# Patient Record
Sex: Female | Born: 1950 | Race: White | Hispanic: No | Smoking: Never smoker
Health system: Southern US, Community
[De-identification: ages and names within clinical notes are randomized; demographics above are authoritative.]

## PROBLEM LIST (undated history)

## (undated) DIAGNOSIS — R011 Cardiac murmur, unspecified: Secondary | ICD-10-CM

## (undated) DIAGNOSIS — J189 Pneumonia, unspecified organism: Secondary | ICD-10-CM

## (undated) DIAGNOSIS — I10 Essential (primary) hypertension: Secondary | ICD-10-CM

## (undated) DIAGNOSIS — C801 Malignant (primary) neoplasm, unspecified: Secondary | ICD-10-CM

## (undated) HISTORY — DX: Essential (primary) hypertension: I10

## (undated) HISTORY — PX: OOPHORECTOMY: SHX86

---

## 2015-05-11 ENCOUNTER — Other Ambulatory Visit: Payer: Self-pay

## 2015-05-11 DIAGNOSIS — Z1231 Encounter for screening mammogram for malignant neoplasm of breast: Secondary | ICD-10-CM

## 2015-06-01 ENCOUNTER — Ambulatory Visit
Admission: RE | Admit: 2015-06-01 | Discharge: 2015-06-01 | Disposition: A | Discharge status: Home / Self Care | Payer: BLUE CROSS/BLUE SHIELD | Source: Ambulatory Visit

## 2015-06-01 DIAGNOSIS — Z1231 Encounter for screening mammogram for malignant neoplasm of breast: Secondary | ICD-10-CM

## 2016-05-18 DIAGNOSIS — L570 Actinic keratosis: Secondary | ICD-10-CM | POA: Diagnosis not present

## 2016-05-18 DIAGNOSIS — D229 Melanocytic nevi, unspecified: Secondary | ICD-10-CM | POA: Diagnosis not present

## 2016-05-18 DIAGNOSIS — L814 Other melanin hyperpigmentation: Secondary | ICD-10-CM | POA: Diagnosis not present

## 2016-05-31 DIAGNOSIS — I87393 Chronic venous hypertension (idiopathic) with other complications of bilateral lower extremity: Secondary | ICD-10-CM | POA: Diagnosis not present

## 2016-10-10 DIAGNOSIS — Z0000 Encounter for general adult medical examination without abnormal findings: Secondary | ICD-10-CM | POA: Diagnosis not present

## 2016-10-10 DIAGNOSIS — I10 Essential (primary) hypertension: Secondary | ICD-10-CM | POA: Diagnosis not present

## 2016-10-11 ENCOUNTER — Other Ambulatory Visit: Payer: Self-pay | Admitting: Internal Medicine

## 2016-10-11 DIAGNOSIS — R198 Other specified symptoms and signs involving the digestive system and abdomen: Secondary | ICD-10-CM

## 2016-10-14 ENCOUNTER — Other Ambulatory Visit: Payer: BLUE CROSS/BLUE SHIELD

## 2016-10-18 ENCOUNTER — Ambulatory Visit
Admission: RE | Admit: 2016-10-18 | Discharge: 2016-10-18 | Disposition: A | Discharge status: Home / Self Care | Payer: Medicare Other | Source: Ambulatory Visit | Attending: Internal Medicine | Admitting: Internal Medicine

## 2016-10-18 DIAGNOSIS — R198 Other specified symptoms and signs involving the digestive system and abdomen: Secondary | ICD-10-CM

## 2016-10-18 DIAGNOSIS — Z8249 Family history of ischemic heart disease and other diseases of the circulatory system: Secondary | ICD-10-CM | POA: Diagnosis not present

## 2016-10-18 DIAGNOSIS — R0989 Other specified symptoms and signs involving the circulatory and respiratory systems: Secondary | ICD-10-CM | POA: Diagnosis not present

## 2016-11-14 DIAGNOSIS — I10 Essential (primary) hypertension: Secondary | ICD-10-CM | POA: Diagnosis not present

## 2016-11-21 DIAGNOSIS — I10 Essential (primary) hypertension: Secondary | ICD-10-CM | POA: Diagnosis not present

## 2016-11-24 DIAGNOSIS — I10 Essential (primary) hypertension: Secondary | ICD-10-CM | POA: Diagnosis not present

## 2016-12-20 DIAGNOSIS — Z23 Encounter for immunization: Secondary | ICD-10-CM | POA: Diagnosis not present

## 2017-02-09 DIAGNOSIS — Z1231 Encounter for screening mammogram for malignant neoplasm of breast: Secondary | ICD-10-CM | POA: Diagnosis not present

## 2017-02-16 ENCOUNTER — Other Ambulatory Visit: Payer: Self-pay | Admitting: Radiology

## 2017-02-16 DIAGNOSIS — C50912 Malignant neoplasm of unspecified site of left female breast: Secondary | ICD-10-CM | POA: Diagnosis not present

## 2017-02-16 DIAGNOSIS — C50512 Malignant neoplasm of lower-outer quadrant of left female breast: Secondary | ICD-10-CM | POA: Diagnosis not present

## 2017-02-16 DIAGNOSIS — N6323 Unspecified lump in the left breast, lower outer quadrant: Secondary | ICD-10-CM | POA: Diagnosis not present

## 2017-02-17 ENCOUNTER — Telehealth: Payer: Self-pay | Admitting: Hematology and Oncology

## 2017-02-17 ENCOUNTER — Encounter: Payer: Self-pay | Admitting: *Deleted

## 2017-02-17 DIAGNOSIS — M25561 Pain in right knee: Secondary | ICD-10-CM | POA: Diagnosis not present

## 2017-02-17 NOTE — Telephone Encounter (Signed)
Confirmed afternoon [facility] appointment with patient for 02/22/17, patient is a [facility] patient but appointment reminder will be mailed

## 2017-02-21 ENCOUNTER — Other Ambulatory Visit: Payer: Self-pay | Admitting: *Deleted

## 2017-02-21 ENCOUNTER — Encounter: Payer: Self-pay | Admitting: *Deleted

## 2017-02-21 DIAGNOSIS — Z170 Estrogen receptor positive status [ER+]: Secondary | ICD-10-CM

## 2017-02-21 DIAGNOSIS — C50512 Malignant neoplasm of lower-outer quadrant of left female breast: Secondary | ICD-10-CM

## 2017-02-22 ENCOUNTER — Other Ambulatory Visit (HOSPITAL_BASED_OUTPATIENT_CLINIC_OR_DEPARTMENT_OTHER): Payer: Medicare Other

## 2017-02-22 ENCOUNTER — Ambulatory Visit
Admission: RE | Admit: 2017-02-22 | Discharge: 2017-02-22 | Disposition: A | Discharge status: Home / Self Care | Payer: Medicare Other | Source: Ambulatory Visit | Attending: Radiation Oncology | Admitting: Radiation Oncology

## 2017-02-22 ENCOUNTER — Encounter: Payer: Self-pay | Admitting: Hematology and Oncology

## 2017-02-22 ENCOUNTER — Encounter: Payer: Self-pay | Admitting: Radiation Oncology

## 2017-02-22 ENCOUNTER — Ambulatory Visit: Payer: Self-pay | Admitting: General Surgery

## 2017-02-22 ENCOUNTER — Ambulatory Visit: Payer: Medicare Other | Admitting: Physical Therapy

## 2017-02-22 ENCOUNTER — Ambulatory Visit (HOSPITAL_BASED_OUTPATIENT_CLINIC_OR_DEPARTMENT_OTHER): Payer: Medicare Other | Admitting: Hematology and Oncology

## 2017-02-22 DIAGNOSIS — Z23 Encounter for immunization: Secondary | ICD-10-CM | POA: Diagnosis not present

## 2017-02-22 DIAGNOSIS — Z510 Encounter for antineoplastic radiation therapy: Secondary | ICD-10-CM | POA: Insufficient documentation

## 2017-02-22 DIAGNOSIS — C50512 Malignant neoplasm of lower-outer quadrant of left female breast: Secondary | ICD-10-CM

## 2017-02-22 DIAGNOSIS — Z170 Estrogen receptor positive status [ER+]: Principal | ICD-10-CM

## 2017-02-22 DIAGNOSIS — C50912 Malignant neoplasm of unspecified site of left female breast: Secondary | ICD-10-CM | POA: Diagnosis not present

## 2017-02-22 DIAGNOSIS — Z8042 Family history of malignant neoplasm of prostate: Secondary | ICD-10-CM | POA: Insufficient documentation

## 2017-02-22 DIAGNOSIS — I10 Essential (primary) hypertension: Secondary | ICD-10-CM | POA: Insufficient documentation

## 2017-02-22 DIAGNOSIS — Z801 Family history of malignant neoplasm of trachea, bronchus and lung: Secondary | ICD-10-CM | POA: Insufficient documentation

## 2017-02-22 DIAGNOSIS — Z803 Family history of malignant neoplasm of breast: Secondary | ICD-10-CM | POA: Diagnosis not present

## 2017-02-22 LAB — COMPREHENSIVE METABOLIC PANEL
ALBUMIN: 4.5 g/dL (ref 3.5–5.0)
ALK PHOS: 63 U/L (ref 40–150)
ALT: 21 U/L (ref 0–55)
AST: 21 U/L (ref 5–34)
Anion Gap: 11 mEq/L (ref 3–11)
BUN: 11.1 mg/dL (ref 7.0–26.0)
CO2: 29 mEq/L (ref 22–29)
Calcium: 10 mg/dL (ref 8.4–10.4)
Chloride: 94 mEq/L — ABNORMAL LOW (ref 98–109)
Creatinine: 0.9 mg/dL (ref 0.6–1.1)
GLUCOSE: 137 mg/dL (ref 70–140)
POTASSIUM: 4.1 meq/L (ref 3.5–5.1)
SODIUM: 134 meq/L — AB (ref 136–145)
Total Bilirubin: 0.96 mg/dL (ref 0.20–1.20)
Total Protein: 7.5 g/dL (ref 6.4–8.3)

## 2017-02-22 LAB — CBC WITH DIFFERENTIAL/PLATELET
BASO%: 0.5 % (ref 0.0–2.0)
BASOS ABS: 0 10*3/uL (ref 0.0–0.1)
EOS ABS: 0 10*3/uL (ref 0.0–0.5)
EOS%: 0.6 % (ref 0.0–7.0)
HCT: 41.1 % (ref 34.8–46.6)
HEMOGLOBIN: 13.6 g/dL (ref 11.6–15.9)
LYMPH%: 18.9 % (ref 14.0–49.7)
MCH: 31.5 pg (ref 25.1–34.0)
MCHC: 33.1 g/dL (ref 31.5–36.0)
MCV: 95.2 fL (ref 79.5–101.0)
MONO#: 0.4 10*3/uL (ref 0.1–0.9)
MONO%: 7.4 % (ref 0.0–14.0)
NEUT#: 3.8 10*3/uL (ref 1.5–6.5)
NEUT%: 72.6 % (ref 38.4–76.8)
Platelets: 306 10*3/uL (ref 145–400)
RBC: 4.32 10*6/uL (ref 3.70–5.45)
RDW: 13.1 % (ref 11.2–14.5)
WBC: 5.2 10*3/uL (ref 3.9–10.3)
lymph#: 1 10*3/uL (ref 0.9–3.3)

## 2017-02-22 NOTE — Progress Notes (Signed)
Nutrition Assessment  Reason for Assessment:  Pt seen in Breast Clinic  ASSESSMENT:    66 year old female with new diagnosis of breast cancer.  Past medical history reviewed.  Patient reports good appetite  Medications:  reviewed  Labs: reviewed  Anthropometrics:   Height: 67 inches Weight: 140 lb BMI: 21   NUTRITION DIAGNOSIS: Food and nutrition related knowledge deficit related to new diagnosis of breast cancer as evidenced by no prior need for nutrition related information.  INTERVENTION:   Discussed and provided packet of information regarding nutritional tips for breast cancer patients.  Questions answered.  Teachback method used.  Contact information provided and patient knows to contact me with questions/concerns.    MONITORING, EVALUATION, and GOAL: Pt will consume a healthy plant based diet to maintain lean body mass throughout treatment.   Iver Miklas B. Zenia Resides, [place], [place] Registered Dietitian [phone] (pager)

## 2017-02-22 NOTE — Progress Notes (Signed)
Radiation Oncology         [phone] ________________________________  Name: Nichole Ortega        MRN: [id number]  Date of Service: 02/22/2017 DOB: [birth year]  CC:Kim, Jeneen Rinks, MD  Excell Seltzer, MD     REFERRING PHYSICIAN: Excell Seltzer, MD   ATTESTATION Please see the note from Shona Simpson, PA-C from today's visit for more details of today's encounter.  I have personally performed a face to face diagnostic evaluation on this patient and devised the following assessment and plan.  The patient was seen today in clinic preoperatively for her diagnosis of breast cancer. The patient appears to be a good candidate for breast conservation treatment. We discussed the recommendation to subsequently proceed with adjuvant radiation treatment at the appropriate time. We discussed the potential benefit of treatment as well as possible side effects and risks. All of the patient's questions were answered. I look forward to seeing the patient at the appropriate time postoperatively to further coordinate her care. I would anticipate treating the patient with tangent whole breast radiation treatment fields for 4 - 6 1/2 weeks, potentially 4 weeks base on her current information.   Kyung Rudd, MD    DIAGNOSIS: The encounter diagnosis was Malignant neoplasm of lower-outer quadrant of left breast of female, estrogen receptor positive ([place]).   HISTORY OF PRESENT ILLNESS: Nichole Ortega is a 67 y.o. female seen at multidisciplinary clinic for newly diagnosed left breast cancer. Routine screening mammogram on 02/09/17 detected an abnormality in the lower outer quadrant of the left breast. Ultrasound of the left breast on 02/16/17 showed an 8 mm mass at the 4 o'clock position. Axilla was negative. Biopsy on of the mass on 02/16/17 showed invasive mammary carcinoma, grade II. Receptor status was ER+ PR+ Ki67 15% and Her2 negative. The patient presents today to discuss the role of radiation therapy as  part of her disease management.   PREVIOUS RADIATION THERAPY: No   PAST MEDICAL HISTORY:  Past Medical History:  Diagnosis Date  . Hypertension        PAST SURGICAL HISTORY: Past Surgical History:  Procedure Laterality Date  . OOPHORECTOMY Right      FAMILY HISTORY:  Family History  Problem Relation Age of Onset  . Breast cancer Paternal Grandmother 12  . Breast cancer Cousin 72     SOCIAL HISTORY:  reports that  has never smoked. She does not have any smokeless tobacco history on file. She reports that she drinks alcohol. She reports that she does not use drugs.   ALLERGIES: Patient has no known allergies.   MEDICATIONS:  Current Outpatient Medications  Medication Sig Dispense Refill  . amLODipine (NORVASC) 10 MG tablet Take 10 mg by mouth daily.     No current facility-administered medications for this encounter.      REVIEW OF SYSTEMS: On review of systems, the patient reports that she is positive for right leg muscle aches, eczema, and joint pain.  She denies any chest pain, shortness of breath, cough, fevers, chills, night sweats, unintended weight changes. She denies any bowel or bladder disturbances, and denies abdominal pain, nausea or vomiting. She denies any new musculoskeletal or joint aches or pains. A complete review of systems is obtained and is otherwise negative.     PHYSICAL EXAM:  Wt Readings from Last 3 Encounters:  02/22/17 140 lb 3.2 oz (63.6 kg)   Temp Readings from Last 3 Encounters:  02/22/17 98.3 F (36.8 C) (Oral)   BP Readings from  Last 3 Encounters:  02/22/17 (!) 148/74   Pulse Readings from Last 3 Encounters:  02/22/17 80    /10  In general this is a well appearing woman in no acute distress. She is alert and oriented x4 and appropriate throughout the examination. HEENT reveals that the patient is normocephalic, atraumatic. EOMs are intact. PERRLA. Skin is intact without any evidence of gross lesions. Cardiovascular exam  reveals a regular rate and rhythm, no clicks rubs or murmurs are auscultated. Chest is clear to auscultation bilaterally. Lymphatic assessment is performed and does not reveal any adenopathy in the cervical, supraclavicular, axillary, or inguinal chains. Abdomen has active bowel sounds in all quadrants and is intact. The abdomen is soft, non tender, non distended. Lower extremities are negative for pretibial pitting edema, deep calf tenderness, cyanosis or clubbing.  Breast exam revealed ecchymosis at the biopsy site. No nipple discharge or palpable masses bilaterally.   ECOG = 0  0 - Asymptomatic (Fully active, able to carry on all predisease activities without restriction)  1 - Symptomatic but completely ambulatory (Restricted in physically strenuous activity but ambulatory and able to carry out work of a light or sedentary nature. For example, light housework, office work)  2 - Symptomatic, <50% in bed during the day (Ambulatory and capable of all self care but unable to carry out any work activities. Up and about more than 50% of waking hours)  3 - Symptomatic, >50% in bed, but not bedbound (Capable of only limited self-care, confined to bed or chair 50% or more of waking hours)  4 - Bedbound (Completely disabled. Cannot carry on any self-care. Totally confined to bed or chair)  5 - Death   Eustace Pen MM, Creech RH, Tormey DC, et al. [phone]). "Toxicity and response criteria of the [facility]". [place] Oncol. 5 (6): 649-55    LABORATORY DATA:  Lab Results  Component Value Date   WBC 5.2 02/22/2017   HGB 13.6 02/22/2017   HCT 41.1 02/22/2017   MCV 95.2 02/22/2017   PLT 306 02/22/2017   Lab Results  Component Value Date   NA 134 (L) 02/22/2017   K 4.1 02/22/2017   CO2 29 02/22/2017   Lab Results  Component Value Date   ALT 21 02/22/2017   AST 21 02/22/2017   ALKPHOS 63 02/22/2017   BILITOT 0.96 02/22/2017      RADIOGRAPHY: No results found.      IMPRESSION/PLAN: 1. 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 Dr. Lisbeth Renshaw discussed the pathology findings and nature of breast cancer. We discussed the risks, benefits, short and long term effects of radiotherapy. The patient understands and wishes to proceed. Dr. Lisbeth Renshaw discussed the logistics of radiotherapy and anticipates 4 weeks of treatment. The patient will undergo breast conserving treatment and be scheduled for left lumpectomy with sentinel lymph node biopsy followed by radiation therapy. This will be followed by an aromatase inhibitor.  The above documentation reflects my direct findings during this shared patient visit. Please see the separate note by Dr. Lisbeth Renshaw on this date for the remainder of the patient's plan of care.    This document serves as a record of services personally performed by Kyung Rudd, MD and Freeman Caldron, PA-C. It was created on their behalf by Bethann Humble, a trained medical scribe. The creation of this record is based on the scribe's personal observations and the provider's statements to them. This document has  been checked and approved by the attending provider.

## 2017-02-22 NOTE — Assessment & Plan Note (Signed)
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 After reviewing the pathology in detail, we proceeded to discuss the different treatment options between surgery, radiation, chemotherapy, antiestrogen therapies.  Recommendations: 1. Breast conserving surgery followed by 2. Oncotype DX testing to determine if chemotherapy would be of any benefit followed by 3. Adjuvant radiation therapy followed by 4. Adjuvant antiestrogen therapy  Oncotype counseling: I discussed Oncotype DX test. I explained to the patient that this is a 21 gene panel to evaluate patient tumors DNA to calculate recurrence score. This would help determine whether patient has high risk or intermediate risk or low risk breast cancer. She understands that if her tumor was found to be high risk, she would benefit from systemic chemotherapy. If low risk, no need of chemotherapy. If she was found to be intermediate risk, we would need to evaluate the score as well as other risk factors and determine if an abbreviated chemotherapy may be of benefit.  Patient is not keen on chemotherapy or even in antiestrogen therapy.  She is willing to consider antiestrogen therapy if she has a high risk of recurrence.  So for this reason alone we may need to perform Oncotype DX testing.  Return to clinic after surgery to discuss the final pathology report.   

## 2017-02-22 NOTE — Progress Notes (Signed)
[place] NOTE  Patient Care Team: Jani Gravel, MD as PCP - General (Internal Medicine)  CHIEF COMPLAINTS/PURPOSE OF CONSULTATION:  Newly diagnosed breast cancer  HISTORY OF PRESENTING ILLNESS:  Nichole Ortega 66 y.o. female is here because of recent diagnosis of left breast cancer.  Patient had a screening mammogram that detected a left breast mass at 4 o'clock position measuring 8 mm in size.  Biopsy of this revealed invasive ductal carcinoma that was ER PR positive HER-2 negative with a Ki-67 of 15%.  She was presented this morning in the multidisciplinary tumor board and she is here today to discuss her treatment plan.  She came by herself to the clinic.  I reviewed her records extensively and collaborated the history with the patient.  SUMMARY OF ONCOLOGIC HISTORY:   Malignant neoplasm of lower-outer quadrant of left breast of female, estrogen receptor positive ([place])   02/16/2017 Initial Diagnosis    Screening detected left breast mass 8 mm at 4 o'clock position axilla negative biopsy grade 2 invasive ductal carcinoma ER 100%, PR 100%, Ki-67 15%, HER-2 negative ratio 1.58, T1 BN 0 stage I a clinical stage      MEDICAL HISTORY:  Past Medical History:  Diagnosis Date  . Hypertension     SURGICAL HISTORY: Past Surgical History:  Procedure Laterality Date  . OOPHORECTOMY Right     SOCIAL HISTORY: Social History   Socioeconomic History  . Marital status: Widowed    Spouse name: Not on file  . Number of children: Not on file  . Years of education: Not on file  . Highest education level: Not on file  Social Needs  . Financial resource strain: Not on file  . Food insecurity - worry: Not on file  . Food insecurity - inability: Not on file  . Transportation needs - medical: Not on file  . Transportation needs - non-medical: Not on file  Occupational History  . Not on file  Tobacco Use  . Smoking status: Never Smoker  Substance and Sexual Activity   . Alcohol use: Yes  . Drug use: No  . Sexual activity: Not on file  Other Topics Concern  . Not on file  Social History Narrative  . Not on file    FAMILY HISTORY: Family History  Problem Relation Age of Onset  . Breast cancer Paternal Grandmother 64  . Breast cancer Cousin 67    ALLERGIES:  has no allergies on file.  MEDICATIONS:  Current Outpatient Medications  Medication Sig Dispense Refill  . amLODipine (NORVASC) 10 MG tablet Take 10 mg by mouth daily.     No current facility-administered medications for this visit.     REVIEW OF SYSTEMS:   Constitutional: Denies fevers, chills or abnormal night sweats Eyes: Denies blurriness of vision, double vision or watery eyes Ears, nose, mouth, throat, and face: Denies mucositis or sore throat Respiratory: Denies cough, dyspnea or wheezes Cardiovascular: Denies palpitation, chest discomfort or lower extremity swelling Gastrointestinal:  Denies nausea, heartburn or change in bowel habits Skin: Denies abnormal skin rashes Lymphatics: Denies new lymphadenopathy or easy bruising Neurological:Denies numbness, tingling or new weaknesses Behavioral/Psych: Mood is stable, no new changes  Breast:  Denies any palpable lumps or discharge All other systems were reviewed with the patient and are negative.  PHYSICAL EXAMINATION: ECOG PERFORMANCE STATUS: 0 - Asymptomatic  Vitals:   02/22/17 1301  BP: (!) 148/74  Pulse: 80  Resp: 18  Temp: 98.3 F (36.8 C)  SpO2: 99%  Filed Weights   02/22/17 1301  Weight: 140 lb 3.2 oz (63.6 kg)    GENERAL:alert, no distress and comfortable SKIN: skin color, texture, turgor are normal, no rashes or significant lesions EYES: normal, conjunctiva are pink and non-injected, sclera clear OROPHARYNX:no exudate, no erythema and lips, buccal mucosa, and tongue normal  NECK: supple, thyroid normal size, non-tender, without nodularity LYMPH:  no palpable lymphadenopathy in the cervical, axillary or  inguinal LUNGS: clear to auscultation and percussion with normal breathing effort HEART: regular rate & rhythm and no murmurs and no lower extremity edema ABDOMEN:abdomen soft, non-tender and normal bowel sounds Musculoskeletal:no cyanosis of digits and no clubbing  PSYCH: alert & oriented x 3 with fluent speech NEURO: no focal motor/sensory deficits BREAST: No palpable nodules in breast. No palpable axillary or supraclavicular lymphadenopathy (exam performed in the presence of a chaperone)   LABORATORY DATA:  I have reviewed the data as listed Lab Results  Component Value Date   WBC 5.2 02/22/2017   HGB 13.6 02/22/2017   HCT 41.1 02/22/2017   MCV 95.2 02/22/2017   PLT 306 02/22/2017   Lab Results  Component Value Date   NA 134 (L) 02/22/2017   K 4.1 02/22/2017   CO2 29 02/22/2017    RADIOGRAPHIC STUDIES: I have personally reviewed the radiological reports and agreed with the findings in the report.  ASSESSMENT AND PLAN:  Malignant neoplasm of lower-outer quadrant of left breast of female, estrogen receptor positive ([place]) 02/16/2017:Screening detected left breast mass 8 mm at 4 o'clock position axilla negative biopsy grade 2 invasive ductal carcinoma ER 100%, PR 100%, Ki-67 15%, HER-2 negative ratio 1.58, T1 BN 0 stage I a clinical stage  Pathology and radiology counseling:Discussed with the patient, the details of pathology including the type of breast cancer,the clinical staging, the significance of ER, PR and HER-2/neu receptors and the implications for treatment. After reviewing the pathology in detail, we proceeded to discuss the different treatment options between surgery, radiation, chemotherapy, antiestrogen therapies.  Recommendations: 1. Breast conserving surgery followed by 2. Oncotype DX testing to determine if chemotherapy would be of any benefit followed by 3. Adjuvant radiation therapy followed by 4. Adjuvant antiestrogen therapy  Oncotype counseling: I  discussed Oncotype DX test. I explained to the patient that this is a 21 gene panel to evaluate patient tumors DNA to calculate recurrence score. This would help determine whether patient has high risk or intermediate risk or low risk breast cancer. She understands that if her tumor was found to be high risk, she would benefit from systemic chemotherapy. If low risk, no need of chemotherapy. If she was found to be intermediate risk, we would need to evaluate the score as well as other risk factors and determine if an abbreviated chemotherapy may be of benefit.  Patient is not keen on chemotherapy or even in antiestrogen therapy.  She is willing to consider antiestrogen therapy if she has a high risk of recurrence.  So for this reason alone we may need to perform Oncotype DX testing.  She also has a friend who is an Materials engineer and she will run the plan by him. Return to clinic after surgery to discuss the final pathology report.  All questions were answered. The patient knows to call the clinic with any problems, questions or concerns.    [name], MD 02/22/17

## 2017-02-23 ENCOUNTER — Encounter: Payer: Self-pay | Admitting: Radiation Oncology

## 2017-02-25 ENCOUNTER — Telehealth: Payer: Self-pay | Admitting: Hematology and Oncology

## 2017-02-25 NOTE — Telephone Encounter (Signed)
Spoke with patient regarding appt per sched msg 12/20

## 2017-02-27 ENCOUNTER — Other Ambulatory Visit (HOSPITAL_COMMUNITY): Payer: Self-pay | Admitting: *Deleted

## 2017-02-27 NOTE — Pre-Procedure Instructions (Signed)
Nichole Ortega  02/27/2017    Your procedure is scheduled on Friday, March 03, 2017 at 9:30 AM.   Report to [facility] Entrance "A" Admitting Office at 7:30 AM.   Call this number if you have problems the morning of surgery: [phone]   Questions prior to day of surgery, please call [phone] between 8 & 4 PM.   Remember:  Do not eat food or drink liquids after midnight Thursday, 03/02/17.  Take these medicines the morning of surgery with A SIP OF WATER: Amlodipine (Norvasc)  Drink bottle of Ensure just prior to leaving for hospital day of surgery.   Do not wear jewelry, make-up or nail polish.  Do not wear lotions, powders, perfumes or deodorant.  Do not shave 48 hours prior to surgery.   Do not bring valuables to the hospital.  [facility] is not responsible for any belongings or valuables.  Contacts, dentures or bridgework may not be worn into surgery.  Leave your suitcase in the car.  After surgery it may be brought to your room.  For patients admitted to the hospital, discharge time will be determined by your treatment team.  Patients discharged the day of surgery will not be allowed to drive home.   [place] - Preparing for Surgery  Before surgery, you can play an important role.  Because skin is not sterile, your skin needs to be as free of germs as possible.  You can reduce the number of germs on you skin by washing with CHG (chlorahexidine gluconate) soap before surgery.  CHG is an antiseptic cleaner which kills germs and bonds with the skin to continue killing germs even after washing.  Please DO NOT use if you have an allergy to CHG or antibacterial soaps.  If your skin becomes reddened/irritated stop using the CHG and inform your nurse when you arrive at Short Stay.  Do not shave (including legs and underarms) for at least 48 hours prior to the first CHG shower.  You may shave your face.  Please follow these instructions carefully:   1.   Shower with CHG Soap the night before surgery and the                    morning of Surgery.  2.  If you choose to wash your hair, wash your hair first as usual with your       normal shampoo.  3.  After you shampoo, rinse your hair and body thoroughly to remove the shampoo.  4.  Use CHG as you would any other liquid soap.  You can apply chg directly       to the skin and wash gently with scrungie or a clean washcloth.  5.  Apply the CHG Soap to your body ONLY FROM THE NECK DOWN.        Do not use on open wounds or open sores.  Avoid contact with your eyes, ears, mouth and genitals (private parts).  Wash genitals (private parts) with your normal soap.  6.  Wash thoroughly, paying special attention to the area where your surgery        will be performed.  7.  Thoroughly rinse your body with warm water from the neck down.  8.  DO NOT shower/wash with your normal soap after using and rinsing off       the CHG Soap.  9.  Pat yourself dry with a clean towel.  10.  Wear clean pajamas.            11.  Place clean sheets on your bed the night of your first shower and do not        sleep with pets.  Day of Surgery  Shower as above. Do not apply any lotions/deodorants the morning of surgery.  Please wear clean clothes to the hospital.   Please read over the fact sheets that you were given.

## 2017-03-01 ENCOUNTER — Encounter (HOSPITAL_COMMUNITY): Payer: Self-pay

## 2017-03-01 ENCOUNTER — Encounter (HOSPITAL_COMMUNITY)
Admission: RE | Admit: 2017-03-01 | Discharge: 2017-03-01 | Disposition: A | Discharge status: Home / Self Care | Payer: Medicare Other | Source: Ambulatory Visit | Attending: General Surgery | Admitting: General Surgery

## 2017-03-01 ENCOUNTER — Encounter: Payer: Self-pay | Admitting: General Practice

## 2017-03-01 ENCOUNTER — Other Ambulatory Visit: Payer: Self-pay

## 2017-03-01 ENCOUNTER — Telehealth: Payer: Self-pay | Admitting: *Deleted

## 2017-03-01 DIAGNOSIS — Z170 Estrogen receptor positive status [ER+]: Secondary | ICD-10-CM | POA: Diagnosis not present

## 2017-03-01 DIAGNOSIS — I10 Essential (primary) hypertension: Secondary | ICD-10-CM | POA: Diagnosis not present

## 2017-03-01 DIAGNOSIS — C50512 Malignant neoplasm of lower-outer quadrant of left female breast: Secondary | ICD-10-CM | POA: Diagnosis not present

## 2017-03-01 DIAGNOSIS — N6012 Diffuse cystic mastopathy of left breast: Secondary | ICD-10-CM | POA: Diagnosis not present

## 2017-03-01 HISTORY — DX: Pneumonia, unspecified organism: J18.9

## 2017-03-01 HISTORY — DX: Malignant (primary) neoplasm, unspecified: C80.1

## 2017-03-01 HISTORY — DX: Cardiac murmur, unspecified: R01.1

## 2017-03-01 LAB — BASIC METABOLIC PANEL
ANION GAP: 12 (ref 5–15)
BUN: 9 mg/dL (ref 6–20)
CHLORIDE: 95 mmol/L — AB (ref 101–111)
CO2: 25 mmol/L (ref 22–32)
Calcium: 9.8 mg/dL (ref 8.9–10.3)
Creatinine, Ser: 0.5 mg/dL (ref 0.44–1.00)
GFR calc non Af Amer: >60 mL/min (ref >60)
GLUCOSE: 91 mg/dL (ref 65–99)
POTASSIUM: 4.2 mmol/L (ref 3.5–5.1)
Sodium: 132 mmol/L — ABNORMAL LOW (ref 135–145)

## 2017-03-01 LAB — CBC
HEMATOCRIT: 41.5 % (ref 36.0–46.0)
HEMOGLOBIN: 13.8 g/dL (ref 12.0–15.0)
MCH: 31.8 pg (ref 26.0–34.0)
MCHC: 33.3 g/dL (ref 30.0–36.0)
MCV: 95.6 fL (ref 78.0–100.0)
Platelets: 326 10*3/uL (ref 150–400)
RBC: 4.34 MIL/uL (ref 3.87–5.11)
RDW: 12.8 % (ref 11.5–15.5)
WBC: 6.8 10*3/uL (ref 4.0–10.5)

## 2017-03-01 NOTE — Pre-Procedure Instructions (Signed)
Nichole Ortega  03/01/2017    Your procedure is scheduled on Friday, March 03, 2017 at 9:30 AM.   Report to [facility] Entrance "A" Admitting Office at 7:30 AM.   Call this number if you have problems the morning of surgery: [phone]   Questions prior to day of surgery, please call [phone] between 8 & 4 PM.   Remember:  Do not eat food or drink liquids after midnight Thursday, 03/02/17.  Take these medicines the morning of surgery with A SIP OF WATER: Amlodipine (Norvasc)  EXCEPTION:   Drink bottle of Ensure just prior to leaving for the  hospital day of surgery.   Do not wear jewelry, make-up or nail polish.  Do not wear lotions, powders, perfumes or deodorant.  Do not shave 48 hours prior to surgery.   Do not bring valuables to the hospital.  [facility] is not responsible for any belongings or valuables.  Contacts, dentures or bridgework may not be worn into surgery.  Leave your suitcase in the car.  After surgery it may be brought to your room.  For patients admitted to the hospital, discharge time will be determined by your treatment team.  Patients discharged the day of surgery will not be allowed to drive home.   [place] - Preparing for Surgery  Before surgery, you can play an important role.  Because skin is not sterile, your skin needs to be as free of germs as possible.  You can reduce the number of germs on you skin by washing with CHG (chlorahexidine gluconate) soap before surgery.  CHG is an antiseptic cleaner which kills germs and bonds with the skin to continue killing germs even after washing.  Please DO NOT use if you have an allergy to CHG or antibacterial soaps.  If your skin becomes reddened/irritated stop using the CHG and inform your nurse when you arrive at Short Stay.  Do not shave (including legs and underarms) for at least 48 hours prior to the first CHG shower.  You may shave your face.  Please follow these instructions  carefully:   1.  Shower with CHG Soap the night before surgery and the                    morning of Surgery.  2.  If you choose to wash your hair, wash your hair first as usual with your       normal shampoo.  3.  After you shampoo, rinse your hair and body thoroughly to remove the shampoo.  4.  Use CHG as you would any other liquid soap.  You can apply chg directly       to the skin and wash gently with scrungie or a clean washcloth.  5.  Apply the CHG Soap to your body ONLY FROM THE NECK DOWN.        Do not use on open wounds or open sores.  Avoid contact with your eyes, ears, mouth and genitals (private parts).  Wash genitals (private parts) with your normal soap.  6.  Wash thoroughly, paying special attention to the area where your surgery        will be performed.  7.  Thoroughly rinse your body with warm water from the neck down.  8.  DO NOT shower/wash with your normal soap after using and rinsing off       the CHG Soap.  9.  Pat yourself dry with a clean towel.  10.  Wear clean pajamas.            11.  Place clean sheets on your bed the night of your first shower and do not        sleep with pets.  Day of Surgery  Shower as above. Do not apply any lotions/deodorants the morning of surgery.  Please wear clean clothes to the hospital.   Please read over the fact sheets that you were given.

## 2017-03-01 NOTE — Telephone Encounter (Signed)
Spoke to pt regarding Nichole Ortega from 02/22/17. Denies questions or concerns regarding dx or treatment care plan. Encourage pt to call with needs. Received verbal understanding.

## 2017-03-01 NOTE — Progress Notes (Signed)
[place] presented to [facility], receiving full packet of [place] team/resources. The patient scored a 1 on the Psychosocial Distress Thermometer which indicates mild distress.  ONCBCN DISTRESS SCREENING 03/01/2017  Distress experienced in past week (1-10) 1  Referral to support programs Yes   Follow up needed: No. Pt screens out per protocol, but please refer to [place] team/programs as needed and page if immediate needs arise. Thank you.   [place], [place], [facility] Pager [phone] Voicemail [phone]

## 2017-03-02 DIAGNOSIS — C50912 Malignant neoplasm of unspecified site of left female breast: Secondary | ICD-10-CM | POA: Diagnosis not present

## 2017-03-02 MED ORDER — CEFAZOLIN SODIUM-DEXTROSE 2-4 GM/100ML-% IV SOLN
2.0000 g | INTRAVENOUS | Status: AC
  Administered 2017-03-03: 2 g via INTRAVENOUS
  Filled 2017-03-02: qty 100

## 2017-03-02 NOTE — H&P (Signed)
History of Present Illness Nichole Ortega Nichole Ortega Frampton MD; 02/22/2017 2:49 PM) The patient is a 66 year old female who presents with breast cancer. She is a postmenopausal female referred by Dr. Marcelo Baldy for evaluation of recently diagnosed carcinoma of the left breast. She recently presented for a screening mamogram revealing a new small area of asymmetry. Subsequent imaging included diagnostic mamogram confirming an area of asymmetry and ultrasound showing and 8 mm rounded mass anteriorly at the 4:00 position. An ultrasound guided breast biopsy was performed on 02/16/2017 with pathology revealing invasive ductal carcinoma of the breast. She is seen now in breast multidisciplinary clinic for initial treatment planning. She has experienced no breast symptoms, specifically lump or pain or nipple discharge. She does not have a personal history of any previous breast problems.  Findings at that time were the following: Tumor size: 0.8 cm Tumor grade: 2 Estrogen Receptor: positive Progesterone Receptor: positive Her-2 neu: negative Lymph node status: negative    Past Surgical History Nichole Pummel, RN; 02/22/2017 7:37 AM) Breast Biopsy  Left. Oral Surgery   Diagnostic Studies History Nichole Pummel, RN; 02/22/2017 7:37 AM) Colonoscopy  1-5 years ago Mammogram  within last year Pap Smear  1-5 years ago  Medication History Nichole Ortega T. Nichole Agerton, MD; 02/22/2017 2:44 PM) Medications Reconciled AmLODIPine Besylate (10MG Tablet, Oral daily) Active.  Social History Nichole Pummel, RN; 02/22/2017 7:37 AM) Alcohol use  Occasional alcohol use. Caffeine use  Coffee, Tea. No drug use  Tobacco use  Never smoker.  Family History Nichole Pummel, RN; 02/22/2017 7:37 AM) Cerebrovascular Accident  Father. Hypertension  Father. Melanoma  Mother. Prostate Cancer  Father. Thyroid problems  Father.  Other Problems Nichole Pummel, RN; 02/22/2017 7:37 AM) High blood pressure   Oophorectomy     Review of Systems Nichole Spillers Ledford RN; 02/22/2017 7:37 AM) General Not Present- Appetite Loss, Chills, Fatigue, Fever, Night Sweats, Weight Gain and Weight Loss. Skin Not Present- Change in Wart/Mole, Dryness, Hives, Jaundice, New Lesions, Non-Healing Wounds, Rash and Ulcer. HEENT Present- Wears glasses/contact lenses. Not Present- Earache, Hearing Loss, Hoarseness, Nose Bleed, Oral Ulcers, Ringing in the Ears, Seasonal Allergies, Sinus Pain, Sore Throat, Visual Disturbances and Yellow Eyes. Respiratory Not Present- Bloody sputum, Chronic Cough, Difficulty Breathing, Snoring and Wheezing. Breast Present- Breast Mass. Not Present- Breast Pain, Nipple Discharge and Skin Changes. Cardiovascular Not Present- Chest Pain, Difficulty Breathing Lying Down, Leg Cramps, Palpitations, Rapid Heart Rate, Shortness of Breath and Swelling of Extremities. Gastrointestinal Not Present- Abdominal Pain, Bloating, Bloody Stool, Change in Bowel Habits, Chronic diarrhea, Constipation, Difficulty Swallowing, Excessive gas, Gets full quickly at meals, Hemorrhoids, Indigestion, Nausea, Rectal Pain and Vomiting. Female Genitourinary Not Present- Frequency, Nocturia, Painful Urination, Pelvic Pain and Urgency. Musculoskeletal Present- Joint Pain, Joint Stiffness and Muscle Pain. Not Present- Back Pain, Muscle Weakness and Swelling of Extremities. Neurological Not Present- Decreased Memory, Fainting, Headaches, Numbness, Seizures, Tingling, Tremor, Trouble walking and Weakness. Psychiatric Not Present- Anxiety, Bipolar, Change in Sleep Pattern, Depression, Fearful and Frequent crying. Endocrine Not Present- Cold Intolerance, Excessive Hunger, Hair Changes, Heat Intolerance, Hot flashes and New Diabetes. Hematology Not Present- Blood Thinners, Easy Bruising, Excessive bleeding, Gland problems, HIV and Persistent Infections.   Physical Exam Nichole Ortega T. [name] MD; 02/22/2017 2:50 PM) The physical exam  findings are as follows: Note:General: Alert, thin healthy-appearing Caucasian female, in no distress Skin: Warm and dry without rash or infection. HEENT: No palpable masses or thyromegaly. Sclera nonicteric. Pupils equal round and reactive. Lymph nodes: No cervical, supraclavicular, nodes palpable. Breasts: Some bruising  lower outer left breast post biopsy. Thickening in this area but I cannot feel any other mass in either breast. No skin changes or nipple inversion or crusting. Lungs: Breath sounds clear and equal. No wheezing or increased work of breathing. Cardiovascular: Regular rate and rhythm without murmer. No JVD or edema. Abdomen: Nondistended. Soft and nontender. No masses palpable. No organomegaly. No palpable hernias. Extremities: No edema or joint swelling or deformity. No chronic venous stasis changes. Neurologic: Alert and fully oriented. Gait normal. No focal weakness. Psychiatric: Normal mood and affect. Thought content appropriate with normal judgement and insight    Assessment & Plan Nichole Ortega T. Tarry Fountain MD; 02/22/2017 2:52 PM) MALIGNANT NEOPLASM OF LEFT BREAST, STAGE 1, ESTROGEN RECEPTOR POSITIVE (C50.912) Impression: 66 year old female with a new diagnosis of cancer of the left breast, lower outer quadrant. Clinical stage IA, ER positive, PR positive, HER-2 negative. I discussed with the patient initial surgical treatment options. We discussed options of breast conservation with lumpectomy or total mastectomy and sentinal lymph node biopsy/dissection. Options for reconstruction were discussed. After discussion she has elected to proceed with breast conservation with lumpectomy and axillary sentinel lymph node biopsy. We discussed the indications and nature of the procedure, and expected recovery, in detail. Surgical risks including anesthetic complications, cardiorespiratory complications, bleeding, infection, wound healing complications, blood clots, lymphedema, local and  distant recurrence and possible need for further surgery based on the final pathology was discussed and understood. Chemotherapy, hormonal therapy and radiation therapy have been discussed. They have been provided with literature regarding the treatment of breast cancer. All questions were answered. They understand and agree to proceed and we will go ahead with scheduling. Current Plans radioactive seed localized left breast lumpectomy and left axillary sentinel lymph node biopsy under general anesthesia as an outpatient

## 2017-03-03 ENCOUNTER — Ambulatory Visit (HOSPITAL_COMMUNITY)
Admission: RE | Admit: 2017-03-03 | Discharge: 2017-03-03 | Disposition: A | Discharge status: Home / Self Care | Payer: Medicare Other | Source: Ambulatory Visit | Attending: General Surgery | Admitting: General Surgery

## 2017-03-03 ENCOUNTER — Encounter (HOSPITAL_COMMUNITY): Payer: Self-pay

## 2017-03-03 ENCOUNTER — Ambulatory Visit (HOSPITAL_COMMUNITY): Payer: Medicare Other | Admitting: Certified Registered Nurse Anesthetist

## 2017-03-03 ENCOUNTER — Encounter (HOSPITAL_COMMUNITY)
Admission: RE | Disposition: A | Discharge status: Home / Self Care | Payer: Self-pay | Source: Ambulatory Visit | Attending: General Surgery

## 2017-03-03 ENCOUNTER — Encounter (HOSPITAL_COMMUNITY)
Admission: RE | Admit: 2017-03-03 | Discharge: 2017-03-03 | Disposition: A | Discharge status: Home / Self Care | Payer: Medicare Other | Source: Ambulatory Visit | Attending: General Surgery | Admitting: General Surgery

## 2017-03-03 DIAGNOSIS — C50512 Malignant neoplasm of lower-outer quadrant of left female breast: Secondary | ICD-10-CM | POA: Insufficient documentation

## 2017-03-03 DIAGNOSIS — I10 Essential (primary) hypertension: Secondary | ICD-10-CM | POA: Insufficient documentation

## 2017-03-03 DIAGNOSIS — N6012 Diffuse cystic mastopathy of left breast: Secondary | ICD-10-CM | POA: Diagnosis not present

## 2017-03-03 DIAGNOSIS — Z170 Estrogen receptor positive status [ER+]: Secondary | ICD-10-CM | POA: Diagnosis not present

## 2017-03-03 DIAGNOSIS — C50912 Malignant neoplasm of unspecified site of left female breast: Secondary | ICD-10-CM

## 2017-03-03 DIAGNOSIS — G8918 Other acute postprocedural pain: Secondary | ICD-10-CM | POA: Diagnosis not present

## 2017-03-03 DIAGNOSIS — C50412 Malignant neoplasm of upper-outer quadrant of left female breast: Secondary | ICD-10-CM | POA: Diagnosis not present

## 2017-03-03 HISTORY — PX: BREAST LUMPECTOMY WITH RADIOACTIVE SEED AND SENTINEL LYMPH NODE BIOPSY: SHX6550

## 2017-03-03 SURGERY — BREAST LUMPECTOMY WITH RADIOACTIVE SEED AND SENTINEL LYMPH NODE BIOPSY
Anesthesia: General | Site: Breast | Laterality: Left

## 2017-03-03 MED ORDER — FENTANYL CITRATE (PF) 250 MCG/5ML IJ SOLN
INTRAMUSCULAR | Status: AC
  Filled 2017-03-03: qty 5

## 2017-03-03 MED ORDER — NEOSTIGMINE METHYLSULFATE 5 MG/5ML IV SOSY
PREFILLED_SYRINGE | INTRAVENOUS | Status: AC
  Filled 2017-03-03: qty 5

## 2017-03-03 MED ORDER — MIDAZOLAM HCL 2 MG/2ML IJ SOLN
2.0000 mg | Freq: Once | INTRAMUSCULAR | Status: AC
  Administered 2017-03-03: 2 mg via INTRAVENOUS

## 2017-03-03 MED ORDER — ONDANSETRON HCL 4 MG/2ML IJ SOLN
INTRAMUSCULAR | Status: AC
  Filled 2017-03-03: qty 2

## 2017-03-03 MED ORDER — PROPOFOL 10 MG/ML IV BOLUS
INTRAVENOUS | Status: DC | PRN
  Administered 2017-03-03: 180 mg via INTRAVENOUS

## 2017-03-03 MED ORDER — LIDOCAINE 2% (20 MG/ML) 5 ML SYRINGE
INTRAMUSCULAR | Status: AC
  Filled 2017-03-03: qty 5

## 2017-03-03 MED ORDER — DEXAMETHASONE SODIUM PHOSPHATE 10 MG/ML IJ SOLN
INTRAMUSCULAR | Status: DC | PRN
  Administered 2017-03-03: 5 mg via INTRAVENOUS

## 2017-03-03 MED ORDER — BUPIVACAINE-EPINEPHRINE (PF) 0.5% -1:200000 IJ SOLN
INTRAMUSCULAR | Status: DC | PRN
  Administered 2017-03-03: 30 mL

## 2017-03-03 MED ORDER — CHLORHEXIDINE GLUCONATE CLOTH 2 % EX PADS: 6.0000 | MEDICATED_PAD | Freq: Once | CUTANEOUS | Status: DC

## 2017-03-03 MED ORDER — BUPIVACAINE-EPINEPHRINE (PF) 0.25% -1:200000 IJ SOLN
INTRAMUSCULAR | Status: AC
  Filled 2017-03-03: qty 30

## 2017-03-03 MED ORDER — TRAMADOL HCL 50 MG PO TABS: 50.0000 mg | ORAL_TABLET | Freq: Four times a day (QID) | ORAL | 1 refills | Status: DC | PRN

## 2017-03-03 MED ORDER — ACETAMINOPHEN 500 MG PO TABS
1000.0000 mg | ORAL_TABLET | ORAL | Status: AC
  Administered 2017-03-03: 1000 mg via ORAL
  Filled 2017-03-03: qty 2

## 2017-03-03 MED ORDER — PROPOFOL 10 MG/ML IV BOLUS
INTRAVENOUS | Status: AC
  Filled 2017-03-03: qty 20

## 2017-03-03 MED ORDER — FENTANYL CITRATE (PF) 100 MCG/2ML IJ SOLN: 25.0000 ug | INTRAMUSCULAR | Status: DC | PRN

## 2017-03-03 MED ORDER — FENTANYL CITRATE (PF) 100 MCG/2ML IJ SOLN
100.0000 ug | Freq: Once | INTRAMUSCULAR | Status: AC
  Administered 2017-03-03: 100 ug via INTRAVENOUS

## 2017-03-03 MED ORDER — PHENYLEPHRINE 40 MCG/ML (10ML) SYRINGE FOR IV PUSH (FOR BLOOD PRESSURE SUPPORT)
PREFILLED_SYRINGE | INTRAVENOUS | Status: DC | PRN
  Administered 2017-03-03 (×2): 40 ug via INTRAVENOUS

## 2017-03-03 MED ORDER — PHENYLEPHRINE HCL 10 MG/ML IJ SOLN
INTRAVENOUS | Status: DC | PRN
  Administered 2017-03-03: 25 ug/min via INTRAVENOUS

## 2017-03-03 MED ORDER — BUPIVACAINE-EPINEPHRINE 0.25% -1:200000 IJ SOLN
INTRAMUSCULAR | Status: DC | PRN
  Administered 2017-03-03: 10 mL

## 2017-03-03 MED ORDER — MIDAZOLAM HCL 2 MG/2ML IJ SOLN
INTRAMUSCULAR | Status: AC
  Filled 2017-03-03: qty 2

## 2017-03-03 MED ORDER — SODIUM CHLORIDE 0.9 % IJ SOLN
INTRAMUSCULAR | Status: AC
  Filled 2017-03-03: qty 10

## 2017-03-03 MED ORDER — 0.9 % SODIUM CHLORIDE (POUR BTL) OPTIME
TOPICAL | Status: DC | PRN
  Administered 2017-03-03: 1000 mL

## 2017-03-03 MED ORDER — EPHEDRINE SULFATE 50 MG/ML IJ SOLN
INTRAMUSCULAR | Status: AC
  Filled 2017-03-03: qty 1

## 2017-03-03 MED ORDER — EPHEDRINE SULFATE-NACL 50-0.9 MG/10ML-% IV SOSY
PREFILLED_SYRINGE | INTRAVENOUS | Status: DC | PRN
  Administered 2017-03-03: 5 mg via INTRAVENOUS

## 2017-03-03 MED ORDER — PHENYLEPHRINE 40 MCG/ML (10ML) SYRINGE FOR IV PUSH (FOR BLOOD PRESSURE SUPPORT)
PREFILLED_SYRINGE | INTRAVENOUS | Status: AC
  Filled 2017-03-03: qty 10

## 2017-03-03 MED ORDER — SCOPOLAMINE 1 MG/3DAYS TD PT72SCOPOLAMINE 1 MG/3DAYS
1.0000 | MEDICATED_PATCH | TRANSDERMAL | Status: DC
Start: 2017-03-03 — End: 2017-03-03
  Administered 2017-03-03: 1.5 mg via TRANSDERMAL
  Filled 2017-03-03: qty 1

## 2017-03-03 MED ORDER — GABAPENTIN 300 MG PO CAPS
300.0000 mg | ORAL_CAPSULE | ORAL | Status: AC
  Administered 2017-03-03: 300 mg via ORAL
  Filled 2017-03-03: qty 1

## 2017-03-03 MED ORDER — FENTANYL CITRATE (PF) 100 MCG/2ML IJ SOLN
INTRAMUSCULAR | Status: DC | PRN
Start: 2017-03-03 — End: 2017-03-03
  Administered 2017-03-03: 50 ug via INTRAVENOUS
  Administered 2017-03-03 (×2): 25 ug via INTRAVENOUS
  Administered 2017-03-03: 50 ug via INTRAVENOUS

## 2017-03-03 MED ORDER — LACTATED RINGERS IV SOLN: INTRAVENOUS | Status: DC

## 2017-03-03 MED ORDER — PROMETHAZINE HCL 25 MG/ML IJ SOLN: 6.2500 mg | INTRAMUSCULAR | Status: DC | PRN

## 2017-03-03 MED ORDER — DEXAMETHASONE SODIUM PHOSPHATE 10 MG/ML IJ SOLN
INTRAMUSCULAR | Status: AC
  Filled 2017-03-03: qty 1

## 2017-03-03 MED ORDER — METHYLENE BLUE 0.5 % INJ SOLN
INTRAVENOUS | Status: AC
  Filled 2017-03-03: qty 10

## 2017-03-03 MED ORDER — FENTANYL CITRATE (PF) 100 MCG/2ML IJ SOLN
INTRAMUSCULAR | Status: AC
  Administered 2017-03-03: 100 ug via INTRAVENOUS
  Filled 2017-03-03: qty 2

## 2017-03-03 MED ORDER — CELECOXIB 200 MG PO CAPS
200.0000 mg | ORAL_CAPSULE | ORAL | Status: AC
  Administered 2017-03-03: 200 mg via ORAL
  Filled 2017-03-03: qty 1

## 2017-03-03 MED ORDER — LACTATED RINGERS IV SOLN
INTRAVENOUS | Status: DC
  Administered 2017-03-03: 08:00:00 via INTRAVENOUS

## 2017-03-03 MED ORDER — ONDANSETRON HCL 4 MG/2ML IJ SOLN
INTRAMUSCULAR | Status: DC | PRN
Start: 2017-03-03 — End: 2017-03-03
  Administered 2017-03-03: 4 mg via INTRAVENOUS

## 2017-03-03 MED ORDER — MIDAZOLAM HCL 2 MG/2ML IJ SOLN
INTRAMUSCULAR | Status: AC
  Administered 2017-03-03: 2 mg via INTRAVENOUS
  Filled 2017-03-03: qty 2

## 2017-03-03 MED ORDER — TECHNETIUM TC 99M SULFUR COLLOID FILTERED
1.0000 | Freq: Once | INTRAVENOUS | Status: AC | PRN
  Administered 2017-03-03: 1 via INTRADERMAL

## 2017-03-03 SURGICAL SUPPLY — 49 items
APPLIER CLIP 9.375 MED OPEN (MISCELLANEOUS)
BINDER BREAST LRG (GAUZE/BANDAGES/DRESSINGS) IMPLANT
BINDER BREAST XLRG (GAUZE/BANDAGES/DRESSINGS) IMPLANT
BLADE SURG 15 STRL LF DISP TIS (BLADE) ×1 IMPLANT
BLADE SURG 15 STRL SS (BLADE) ×2
CANISTER SUCT 3000ML PPV (MISCELLANEOUS) ×3 IMPLANT
CHLORAPREP W/TINT 26ML (MISCELLANEOUS) ×3 IMPLANT
CLIP APPLIE 9.375 MED OPEN (MISCELLANEOUS) IMPLANT
CLIP VESOCCLUDE MED 6/CT (CLIP) ×3 IMPLANT
CONT SPEC 4OZ CLIKSEAL STRL BL (MISCELLANEOUS) ×3 IMPLANT
COVER PROBE W GEL 5X96 (DRAPES) ×3 IMPLANT
COVER SURGICAL LIGHT HANDLE (MISCELLANEOUS) ×3 IMPLANT
DERMABOND ADVANCED (GAUZE/BANDAGES/DRESSINGS) ×2
DERMABOND ADVANCED .7 DNX12 (GAUZE/BANDAGES/DRESSINGS) ×1 IMPLANT
DEVICE DUBIN SPECIMEN MAMMOGRA (MISCELLANEOUS) ×3 IMPLANT
DRAPE CHEST BREAST 15X10 FENES (DRAPES) ×3 IMPLANT
DRAPE UTILITY XL STRL (DRAPES) ×3 IMPLANT
DRSG PAD ABDOMINAL 8X10 ST (GAUZE/BANDAGES/DRESSINGS) ×3 IMPLANT
ELECT COATED BLADE 2.86 ST (ELECTRODE) ×3 IMPLANT
ELECT REM PT RETURN 9FT ADLT (ELECTROSURGICAL) ×3
ELECTRODE REM PT RTRN 9FT ADLT (ELECTROSURGICAL) ×1 IMPLANT
GLOVE BIOGEL PI IND STRL 8 (GLOVE) ×1 IMPLANT
GLOVE BIOGEL PI INDICATOR 8 (GLOVE) ×2
GLOVE ECLIPSE 7.5 STRL STRAW (GLOVE) ×6 IMPLANT
GOWN STRL REUS W/ TWL LRG LVL3 (GOWN DISPOSABLE) ×1 IMPLANT
GOWN STRL REUS W/ TWL XL LVL3 (GOWN DISPOSABLE) ×1 IMPLANT
GOWN STRL REUS W/TWL LRG LVL3 (GOWN DISPOSABLE) ×2
GOWN STRL REUS W/TWL XL LVL3 (GOWN DISPOSABLE) ×2
ILLUMINATOR WAVEGUIDE N/F (MISCELLANEOUS) IMPLANT
KIT BASIN OR (CUSTOM PROCEDURE TRAY) ×3 IMPLANT
KIT MARKER MARGIN INK (KITS) ×3 IMPLANT
NDL SAFETY ECLIPSE 18X1.5 (NEEDLE) IMPLANT
NEEDLE FILTER BLUNT 18X 1/2SAF (NEEDLE)
NEEDLE FILTER BLUNT 18X1 1/2 (NEEDLE) IMPLANT
NEEDLE HYPO 18GX1.5 SHARP (NEEDLE)
NEEDLE HYPO 25GX1X1/2 BEV (NEEDLE) ×3 IMPLANT
NS IRRIG 1000ML POUR BTL (IV SOLUTION) ×3 IMPLANT
PACK SURGICAL SETUP 50X90 (CUSTOM PROCEDURE TRAY) ×3 IMPLANT
PENCIL BUTTON HOLSTER BLD 10FT (ELECTRODE) ×3 IMPLANT
SPONGE LAP 18X18 X RAY DECT (DISPOSABLE) ×3 IMPLANT
SUT MON AB 5-0 PS2 18 (SUTURE) ×6 IMPLANT
SUT VIC AB 3-0 SH 18 (SUTURE) ×3 IMPLANT
SYR BULB 3OZ (MISCELLANEOUS) ×3 IMPLANT
SYR CONTROL 10ML LL (SYRINGE) ×3 IMPLANT
TOWEL OR 17X24 6PK STRL BLUE (TOWEL DISPOSABLE) ×3 IMPLANT
TOWEL OR 17X26 10 PK STRL BLUE (TOWEL DISPOSABLE) ×3 IMPLANT
TUBE CONNECTING 12'X1/4 (SUCTIONS) ×1
TUBE CONNECTING 12X1/4 (SUCTIONS) ×2 IMPLANT
YANKAUER SUCT BULB TIP NO VENT (SUCTIONS) ×3 IMPLANT

## 2017-03-03 NOTE — Discharge Instructions (Signed)
[facility] °Office Phone Number [phone] ° °BREAST BIOPSY/ PARTIAL MASTECTOMY: POST OP INSTRUCTIONS ° °Always review your discharge instruction sheet given to you by the facility where your surgery was performed. ° °IF YOU HAVE DISABILITY OR FAMILY LEAVE FORMS, YOU MUST BRING THEM TO THE OFFICE FOR PROCESSING.  DO NOT GIVE THEM TO YOUR DOCTOR. ° °1. A prescription for pain medication may be given to you upon discharge.  Take your pain medication as prescribed, if needed.  If narcotic pain medicine is not needed, then you may take acetaminophen (Tylenol) or ibuprofen (Advil) as needed. °2. Take your usually prescribed medications unless otherwise directed °3. If you need a refill on your pain medication, please contact your pharmacy.  They will contact our office to request authorization.  Prescriptions will not be filled after 5pm or on week-ends. °4. You should eat very light the first 24 hours after surgery, such as soup, crackers, pudding, etc.  Resume your normal diet the day after surgery. °5. Most patients will experience some swelling and bruising in the breast.  Ice packs and a good support bra will help.  Swelling and bruising can take several days to resolve.  °6. It is common to experience some constipation if taking pain medication after surgery.  Increasing fluid intake and taking a stool softener will usually help or prevent this problem from occurring.  A mild laxative (Milk of Magnesia or Miralax) should be taken according to package directions if there are no bowel movements after 48 hours. °7. Unless discharge instructions indicate otherwise, you may remove your bandages 24-48 hours after surgery, and you may shower at that time.  You may have steri-strips (small skin tapes) in place directly over the incision.  These strips should be left on the skin for 7-10 days.  If your surgeon used skin glue on the incision, you may shower in 24 hours.  The glue will flake off over the  next 2-3 weeks.  Any sutures or staples will be removed at the office during your follow-up visit. °8. ACTIVITIES:  You may resume regular daily activities (gradually increasing) beginning the next day.  Wearing a good support bra or sports bra minimizes pain and swelling.  You may have sexual intercourse when it is comfortable. °a. You may drive when you no longer are taking prescription pain medication, you can comfortably wear a seatbelt, and you can safely maneuver your car and apply brakes. °b. RETURN TO WORK:  ______________________________________________________________________________________ °9. You should see your doctor in the office for a follow-up appointment approximately two weeks after your surgery.  Your doctor’s nurse will typically make your follow-up appointment when she calls you with your pathology report.  Expect your pathology report 2-3 business days after your surgery.  You may call to check if you do not hear from us after three days. °10. OTHER INSTRUCTIONS: _______________________________________________________________________________________________ _____________________________________________________________________________________________________________________________________ °_____________________________________________________________________________________________________________________________________ °_____________________________________________________________________________________________________________________________________ ° °WHEN TO CALL YOUR DOCTOR: °1. Fever over 101.0 °2. Nausea and/or vomiting. °3. Extreme swelling or bruising. °4. Continued bleeding from incision. °5. Increased pain, redness, or drainage from the incision. ° °The clinic staff is available to answer your questions during regular business hours.  Please don’t hesitate to call and ask to speak to one of the nurses for clinical concerns.  If you have a medical emergency, go to the nearest  emergency room or call 911.  A surgeon from [facility] is always on call at the hospital. ° °For further questions, please visit [email or web address]  °

## 2017-03-03 NOTE — Anesthesia Preprocedure Evaluation (Addendum)
Anesthesia Evaluation  Patient identified by MRN, date of birth, ID band Patient awake    Reviewed: Allergy & Precautions, NPO status , Patient's Chart, lab work & pertinent test results  History of Anesthesia Complications Negative for: history of anesthetic complications  Airway Mallampati: II  TM Distance: >3 FB Neck ROM: Full    Dental no notable dental hx. (+) Dental Advisory Given   Pulmonary neg pulmonary ROS,    Pulmonary exam normal        Cardiovascular hypertension, Pt. on medications Normal cardiovascular exam     Neuro/Psych negative neurological ROS     GI/Hepatic negative GI ROS, Neg liver ROS,   Endo/Other  negative endocrine ROS  Renal/GU negative Renal ROS     Musculoskeletal negative musculoskeletal ROS (+)   Abdominal   Peds  Hematology negative hematology ROS (+)   Anesthesia Other Findings Day of surgery medications reviewed with the patient.  Reproductive/Obstetrics                            Anesthesia Physical Anesthesia Plan  ASA: II  Anesthesia Plan: General   Post-op Pain Management: GA combined w/ Regional for post-op pain   Induction:   PONV Risk Score and Plan: 3 and Ondansetron, Dexamethasone and Scopolamine patch - Pre-op  Airway Management Planned: LMA  Additional Equipment:   Intra-op Plan:   Post-operative Plan: Extubation in OR  Informed Consent: I have reviewed the patients History and Physical, chart, labs and discussed the procedure including the risks, benefits and alternatives for the proposed anesthesia with the patient or authorized representative who has indicated his/her understanding and acceptance.   Dental advisory given  Plan Discussed with: Anesthesiologist  Anesthesia Plan Comments:        Anesthesia Quick Evaluation

## 2017-03-03 NOTE — Transfer of Care (Signed)
Immediate Anesthesia Transfer of Care Note  Patient: Nichole Ortega  Procedure(s) Performed: RADIOCATIVE SEED GUIDED LEFT BREAST LUMPECTOMY WITH LEFT AXILLARY SENTINEL LYMPH NODE BIOPSY (Left Breast)  Patient Location: PACU  Anesthesia Type:GA combined with regional for post-op pain  Level of Consciousness: awake, alert , oriented and patient cooperative  Airway & Oxygen Therapy: Patient Spontanous Breathing and Patient connected to nasal cannula oxygen  Post-op Assessment: Report given to RN, Post -op Vital signs reviewed and stable and Patient moving all extremities X 4  Post vital signs: Reviewed and stable  Last Vitals:  Vitals:   03/03/17 0840 03/03/17 0845  BP: (!) 102/49 (!) 109/50  Pulse: 92 86  Resp: 16 11  Temp:    SpO2: 99% 100%    Last Pain:  Vitals:   03/03/17 0720  TempSrc: Oral         Complications: No apparent anesthesia complications

## 2017-03-03 NOTE — Anesthesia Procedure Notes (Signed)
Anesthesia Regional Block: Pectoralis block   Pre-Anesthetic Checklist: ,, timeout performed, Correct Patient, Correct Site, Correct Laterality, Correct Procedure, Correct Position, site marked, Risks and benefits discussed,  Surgical consent,  Pre-op evaluation,  At surgeon's request and post-op pain management  Laterality: Left  Prep: chloraprep       Needles:  Injection technique: Single-shot  Needle Type: Echogenic Stimulator Needle     Needle Length: 10cm  Needle Gauge: 21     Additional Needles:   Narrative:  Start time: 03/03/2017 8:13 AM End time: 03/03/2017 8:23 AM Injection made incrementally with aspirations every 5 mL.  Performed by: Personally

## 2017-03-03 NOTE — Anesthesia Procedure Notes (Signed)
Procedure Name: LMA Insertion Date/Time: 03/03/2017 9:39 AM Performed by: Colin Benton, CRNA Pre-anesthesia Checklist: Patient identified, Emergency Drugs available, Suction available and Patient being monitored Patient Re-evaluated:Patient Re-evaluated prior to induction Oxygen Delivery Method: Circle system utilized Preoxygenation: Pre-oxygenation with 100% oxygen Induction Type: IV induction Ventilation: Mask ventilation without difficulty LMA: LMA inserted LMA Size: 4.0 Number of attempts: 1 Placement Confirmation: breath sounds checked- equal and bilateral and positive ETCO2 Tube secured with: Tape Dental Injury: Teeth and Oropharynx as per pre-operative assessment

## 2017-03-03 NOTE — Op Note (Signed)
Preoperative Diagnosis: LEFT BREAST CANCER  Postoprative Diagnosis: LEFT BREAST CANCER  Procedure: Procedure(s): RADIOCATIVE SEED GUIDED LEFT BREAST LUMPECTOMY WITH DEEP LEFT AXILLARY SENTINEL LYMPH NODE BIOPSY   Surgeon: Excell Seltzer T   Assistants: None  Anesthesia:  General LMA anesthesia  Indications: Patient recently presented with a new diagnosis of cancer of the left breast. Findings at that time were the following: Tumor size: 0.8 cm Tumor grade: 2 Estrogen Receptor: positive Progesterone Receptor: positive Her-2 neu: negative Lymph node status: negative  After extensive preoperative workup and discussion detailed elsewhere we have elected to proceed with radioactive seed localized left breast lumpectomy and left axillary sentinel lymph node biopsy as initial surgical therapy area   Procedure Detail: Patient had previously undergone Acker placement of a radioactive seed at the tumor and clip site in the lower outer left breast.  In the holding area she underwent a pectoral block by anesthesia and underwent injection of 1 mCi of technetium sulfur colloid intradermally around the left nipple.  She was taken to the operating room, placed in the supine position on the operating table, and laryngeal mask general anesthesia induced.  The entire left chest and breast, axilla, and upper arm were widely sterilely prepped and draped.  She received preoperative IV antibiotics.  PAS were in place.  Patient timeout was performed and correct procedure verified.  The lumpectomy was approached initially.  The seed was localized just off the areolar border in the 4 o'clock position of the left breast.  A curvilinear incision was made at the areolar border and a skin and subcutaneous flap developed laterally over the area of high counts identified with the neoprobe.  Using the neoprobe for guidance I then excised an approximately 2-1/2 cm globular specimen of breast tissue around the seed.  The  specimen was inked for margins.  Specimen x-ray was obtained showing the seed and clip well contained within the specimen although somewhat toward the inferior aspect of the specimen.  I excised a further inferior margin for about 5 mm which was inked and sent as a additional margin.  Hemostasis was obtained to the lumpectomy cavity.  The cavity was marked with clips.  The deep breast and subcutaneous tissue was closed with interrupted 3-0 Vicryl.  Attention was turned to the sentinel lymph node biopsy.  A hot area in the left axilla was localized and a small transverse incision made.  Dissection was carried down through the subcutaneous tissue.  The clavipectoral fascia was incised.  Using the neoprobe for guidance with careful blunt dissection I dissected down to a deep axillary lymph node which was slightly enlarged but soft with markedly elevated counts.  It was completely excised and had counts of over 2000.  Following this there was still moderately elevated count in the axilla.  I found another slightly enlarged soft lymph node with elevated counts and it was excised having counts of about 100.  A third small lymph node was identified with elevated counts which was completely excised and ex vivo had counts of about 400.  At this point background in the axilla was minimal.  There were no palpable abnormalities.  Hemostasis was assured and the deep axillary tissue closed with interrupted 3-0 Vicryl.  Subcu was closed with interrupted 3-0 Vicryl.  Both skin incisions were infiltrated with Marcaine and closed with running subcuticular 5-0 Monocryl and Dermabond.  Sponge needle and instrument counts were correct.    Findings: Above  Estimated Blood Loss:  Minimal  Drains: None  Blood Given: none          Specimens: #1 left breast lumpectomy oriented with ink   #2 additional inferior margin oriented with ink   #3 left axillary sentinel lymph nodes X 3        Complications:  * No  complications entered in OR log *         Disposition: PACU - hemodynamically stable.         Condition: stable

## 2017-03-03 NOTE — Interval H&P Note (Signed)
History and Physical Interval Note:  03/03/2017 9:11 AM  Nichole Ortega  has presented today for surgery, with the diagnosis of LEFT BREAST CANCER  The various methods of treatment have been discussed with the patient and family. After consideration of risks, benefits and other options for treatment, the patient has consented to  Procedure(s): RADIOCATIVE SEED GUIDED LEFT BREAST LUMPECTOMY WITH LEFT AXILLARY SENTINEL LYMPH NODE BIOPSY (Left) as a surgical intervention .  The patient's history has been reviewed, patient examined, no change in status, stable for surgery.  I have reviewed the patient's chart and labs.  Questions were answered to the patient's satisfaction.     Darene Lamer Jasline Buskirk

## 2017-03-06 ENCOUNTER — Encounter (HOSPITAL_COMMUNITY): Payer: Self-pay | Admitting: General Surgery

## 2017-03-09 NOTE — Assessment & Plan Note (Signed)
03/03/18: Left Lumpectomy:0.8 cm IDC grade 1, 0/3 LN neg,  ER 100%, PR 100%, Ki-67 15%, HER-2 negative ratio 1.58 T1bN0 Stage 1A  Pathology counseling: I discussed the final pathology report of the patient provided  a copy of this report. I discussed the margins as well as lymph node surgeries. We also discussed the final staging along with previously performed ER/PR and HER-2/neu testing.  Plan: 1. Adjuvant XRT 2. Adj Anti-estrogen therapy with Letrozole 2.5 mg daily  RTC after XRT to start anti-estrogen treatment

## 2017-03-10 ENCOUNTER — Inpatient Hospital Stay: Payer: Medicare Other | Attending: Hematology and Oncology | Admitting: Hematology and Oncology

## 2017-03-10 ENCOUNTER — Other Ambulatory Visit: Payer: Self-pay | Admitting: *Deleted

## 2017-03-10 DIAGNOSIS — Z170 Estrogen receptor positive status [ER+]: Secondary | ICD-10-CM

## 2017-03-10 DIAGNOSIS — Z79811 Long term (current) use of aromatase inhibitors: Secondary | ICD-10-CM | POA: Diagnosis not present

## 2017-03-10 DIAGNOSIS — C50512 Malignant neoplasm of lower-outer quadrant of left female breast: Secondary | ICD-10-CM | POA: Diagnosis not present

## 2017-03-10 NOTE — Anesthesia Postprocedure Evaluation (Signed)
Anesthesia Post Note  Patient: Nichole Ortega  Procedure(s) Performed: RADIOCATIVE SEED GUIDED LEFT BREAST LUMPECTOMY WITH LEFT AXILLARY SENTINEL LYMPH NODE BIOPSY (Left Breast)     Patient location during evaluation: PACU Anesthesia Type: General Level of consciousness: sedated Pain management: pain level controlled Vital Signs Assessment: post-procedure vital signs reviewed and stable Respiratory status: spontaneous breathing and respiratory function stable Cardiovascular status: stable Postop Assessment: no apparent nausea or vomiting Anesthetic complications: no    Last Vitals:  Vitals:   03/03/17 1205 03/03/17 1220  BP: 120/75 (!) 115/57  Pulse: 75 73  Resp:    Temp:    SpO2: 94% 96%    Last Pain:  Vitals:   03/03/17 0720  TempSrc: Oral                 [name] DANIEL

## 2017-03-10 NOTE — Progress Notes (Signed)
Patient Care Team: Nichole Gravel, MD as PCP - General (Internal Medicine)  DIAGNOSIS:  Encounter Diagnosis  Name Primary?  . Malignant neoplasm of lower-outer quadrant of left breast of female, estrogen receptor positive ([place])     SUMMARY OF ONCOLOGIC HISTORY:   Malignant neoplasm of lower-outer quadrant of left breast of female, estrogen receptor positive ([place])   02/16/2017 Initial Diagnosis    Screening detected left breast mass 8 mm at 4 o'clock position axilla negative biopsy grade 2 invasive ductal carcinoma ER 100%, PR 100%, Ki-67 15%, HER-2 negative ratio 1.58, T1 BN 0 stage I a clinical stage      03/03/2017 Surgery    Left Lumpectomy:0.8 cm IDC grade 1, 0/3 LN neg,  ER 100%, PR 100%, Ki-67 15%, HER-2 negative ratio 1.58 T1bN0 Stage 1A       CHIEF COMPLIANT: Follow-up after left lumpectomy to discuss pathology report  INTERVAL HISTORY: Nichole Ortega is a 67 year old with above-mentioned history of left breast cancer who underwent lumpectomy and is here today to discuss the pathology report.  She is recovering very well from surgery.  She is not taking any pain medications at this time.  REVIEW OF SYSTEMS:   Constitutional: Denies fevers, chills or abnormal weight loss Eyes: Denies blurriness of vision Ears, nose, mouth, throat, and face: Denies mucositis or sore throat Respiratory: Denies cough, dyspnea or wheezes Cardiovascular: Denies palpitation, chest discomfort Gastrointestinal:  Denies nausea, heartburn or change in bowel habits Skin: Denies abnormal skin rashes Lymphatics: Denies new lymphadenopathy or easy bruising Neurological:Denies numbness, tingling or new weaknesses Behavioral/Psych: Mood is stable, no new changes  Extremities: No lower extremity edema Breast: Recent left lumpectomy All other systems were reviewed with the patient and are negative.  I have reviewed the past medical history, past surgical history, social history and family history with  the patient and they are unchanged from previous note.  ALLERGIES:  is allergic to codeine.  MEDICATIONS:  Current Outpatient Medications  Medication Sig Dispense Refill  . acetaminophen (TYLENOL) 500 MG tablet Take 1,000 mg by mouth every 6 (six) hours as needed.    Marland Kitchen amLODipine (NORVASC) 10 MG tablet Take 10 mg by mouth daily.     No current facility-administered medications for this visit.     PHYSICAL EXAMINATION: ECOG PERFORMANCE STATUS: 1 - Symptomatic but completely ambulatory  Vitals:   03/10/17 1010  BP: (!) 137/52  Pulse: 78  Resp: 15  Temp: 98 F (36.7 C)  SpO2: 99%   Filed Weights   03/10/17 1010  Weight: 142 lb (64.4 kg)    GENERAL:alert, no distress and comfortable SKIN: skin color, texture, turgor are normal, no rashes or significant lesions EYES: normal, Conjunctiva are pink and non-injected, sclera clear OROPHARYNX:no exudate, no erythema and lips, buccal mucosa, and tongue normal  NECK: supple, thyroid normal size, non-tender, without nodularity LYMPH:  no palpable lymphadenopathy in the cervical, axillary or inguinal LUNGS: clear to auscultation and percussion with normal breathing effort HEART: regular rate & rhythm and no murmurs and no lower extremity edema ABDOMEN:abdomen soft, non-tender and normal bowel sounds MUSCULOSKELETAL:no cyanosis of digits and no clubbing  NEURO: alert & oriented x 3 with fluent speech, no focal motor/sensory deficits EXTREMITIES: No lower extremity edema  LABORATORY DATA:  I have reviewed the data as listed   Chemistry      Component Value Date/Time   NA 132 (L) 03/01/2017 1022   NA 134 (L) 02/22/2017 1202   K 4.2 03/01/2017 1022  K 4.1 02/22/2017 1202   CL 95 (L) 03/01/2017 1022   CO2 25 03/01/2017 1022   CO2 29 02/22/2017 1202   BUN 9 03/01/2017 1022   BUN 11.1 02/22/2017 1202   CREATININE 0.50 03/01/2017 1022   CREATININE 0.9 02/22/2017 1202      Component Value Date/Time   CALCIUM 9.8 03/01/2017 1022     CALCIUM 10.0 02/22/2017 1202   ALKPHOS 63 02/22/2017 1202   AST 21 02/22/2017 1202   ALT 21 02/22/2017 1202   BILITOT 0.96 02/22/2017 1202       Lab Results  Component Value Date   WBC 6.8 03/01/2017   HGB 13.8 03/01/2017   HCT 41.5 03/01/2017   MCV 95.6 03/01/2017   PLT 326 03/01/2017   NEUTROABS 3.8 02/22/2017    ASSESSMENT & PLAN:  Malignant neoplasm of lower-outer quadrant of left breast of female, estrogen receptor positive ([place]) 03/03/18: Left Lumpectomy:0.8 cm IDC grade 1, 0/3 LN neg,  ER 100%, PR 100%, Ki-67 15%, HER-2 negative ratio 1.58 T1bN0 Stage 1A  Pathology counseling: I discussed the final pathology report of the patient provided  a copy of this report. I discussed the margins as well as lymph node surgeries. We also discussed the final staging along with previously performed ER/PR and HER-2/neu testing. Based on favorable characteristics being grade 1 and less than 1 cm, I did not recommend Oncotype DX testing.  Patient does not need systemic chemotherapy . Plan: 1. Adjuvant XRT 2. Adj Anti-estrogen therapy with Letrozole 2.5 mg daily Patient was not keen on taking antiestrogen therapy.  I provided her with extensive literature on letrozole as well as tamoxifen. Once she completes radiation we can meet her and finalize the treatment plan. I discussed with her that the risk of distant recurrence is most likely going to be around 15% with tamoxifen and 7% with tamoxifen.  RTC after XRT to start anti-estrogen treatment  I spent 25 minutes talking to the patient of which more than half was spent in counseling and coordination of care.  No orders of the defined types were placed in this encounter.  The patient has a good understanding of the overall plan. she agrees with it. she will call with any problems that may develop before the next visit here.   [name], MD 03/10/17

## 2017-03-14 NOTE — Progress Notes (Signed)
Location of Breast Cancer: lower-outer quadrant of left breast    Histology per Pathology Report:   03/03/17 Diagnosis 1. Breast, lumpectomy, Left - INVASIVE DUCTAL CARCINOME, GRADE I/III, SPANNING 0.8 CM. - THE SURGICAL RESECTION MARGINS ARE NEGATIVE FOR CARCINOMA. - SEE ONCOLOGY TABLE BELOW. 2. Lymph node, sentinel, biopsy, Left - THERE IS NO EVIDENCE OF CARCINOMA IN 1 OF 1 LYMPH NODE (0/1). 3. Lymph node, sentinel, biopsy, Left - THERE IS NO EVIDENCE OF CARCINOMA IN 1 OF 1 LYMPH NODE (0/1). 4. Lymph node, sentinel, biopsy, Left - THERE IS NO EVIDENCE OF CARCINOMA IN 1 OF 1 LYMPH NODE (0/1). 5. Breast, excision, Left Further Inferior Margin - FIBROCYSTIC CHANGES. - THERE IS NO EVIDENCE OF MALIGNANCY.  02/16/17   Receptor Status: ER(100%), PR (100%), Her2-neu (neg), Ki-(15%)  Did patient present with symptoms (if so, please note symptoms) or was this found on screening mammography?: screening mammogram. Reports mammogram done May 22, 2015 was clear  Past/Anticipated interventions by surgeon, if any: 03/03/17 - Procedure: RADIOCATIVE SEED GUIDED LEFT BREAST LUMPECTOMY WITH LEFT AXILLARY SENTINEL LYMPH NODE BIOPSY;  Surgeon: Excell Seltzer, MD  Past/Anticipated interventions by medical oncology, if any: Adj Anti-estrogen therapy with Letrozole 2.5 mg daily  Lymphedema issues, if any:  no left   Pain issues, if any:   reports left breast at surgical incision and left axilla remain sore  SAFETY ISSUES:  Prior radiation? no  Pacemaker/ICD? no  Possible current pregnancy?no  Is the patient on methotrexate? no  Current Complaints / other details:  67 year old female. Widowed. Husband died of liver cancer in 12/19/2014. Reports working deligently to gain back 26lb since her husband's death that she lost due to stress. Reports taking two tylenol midday and at bedtime to manage left breast soreness. Denies left breast nipple discharge or inversion. No left arm lymphedema  noted. Full ROM of left arm demonstrated.    Jacqulyn Liner, RN 03/14/2017,5:02 PM

## 2017-03-21 ENCOUNTER — Ambulatory Visit
Admission: RE | Admit: 2017-03-21 | Discharge: 2017-03-21 | Disposition: A | Discharge status: Home / Self Care | Payer: Medicare Other | Source: Ambulatory Visit | Attending: Radiation Oncology | Admitting: Radiation Oncology

## 2017-03-21 ENCOUNTER — Encounter: Payer: Self-pay | Admitting: Radiation Oncology

## 2017-03-21 ENCOUNTER — Other Ambulatory Visit: Payer: Self-pay

## 2017-03-21 VITALS — BP 129/73 | HR 79 | Resp 18 | Ht 67.0 in | Wt 140.2 lb

## 2017-03-21 DIAGNOSIS — C50512 Malignant neoplasm of lower-outer quadrant of left female breast: Secondary | ICD-10-CM

## 2017-03-21 DIAGNOSIS — Z9889 Other specified postprocedural states: Secondary | ICD-10-CM | POA: Diagnosis not present

## 2017-03-21 DIAGNOSIS — Z170 Estrogen receptor positive status [ER+]: Secondary | ICD-10-CM | POA: Diagnosis not present

## 2017-03-21 DIAGNOSIS — Z803 Family history of malignant neoplasm of breast: Secondary | ICD-10-CM | POA: Diagnosis not present

## 2017-03-21 DIAGNOSIS — Z510 Encounter for antineoplastic radiation therapy: Secondary | ICD-10-CM | POA: Diagnosis not present

## 2017-03-21 DIAGNOSIS — Z8042 Family history of malignant neoplasm of prostate: Secondary | ICD-10-CM | POA: Diagnosis not present

## 2017-03-21 DIAGNOSIS — Z801 Family history of malignant neoplasm of trachea, bronchus and lung: Secondary | ICD-10-CM | POA: Diagnosis not present

## 2017-03-21 DIAGNOSIS — I10 Essential (primary) hypertension: Secondary | ICD-10-CM | POA: Diagnosis not present

## 2017-03-21 NOTE — Progress Notes (Signed)
See progress note under physician encounter. 

## 2017-03-21 NOTE — Progress Notes (Signed)
Radiation Oncology         [phone] ________________________________  Name: Nichole Ortega        MRN: [id number]  Date of Service: 03/21/2017 DOB: Oct 15, 1950  CC:Kim, Jeneen Rinks, MD  Excell Seltzer, MD     REFERRING PHYSICIAN: Excell Seltzer, MD   DIAGNOSIS: The encounter diagnosis was Malignant neoplasm of lower-outer quadrant of left breast of female, estrogen receptor positive ([place]).   HISTORY OF PRESENT ILLNESS: Nichole Ortega is a 67 y.o. female seen at the request of Dr. Excell Seltzer.  She was initially seen at multidisciplinary clinic on 02/22/17 for newly diagnosed left breast cancer. Routine screening mammogram on 02/09/17 detected an abnormality in the lower outer quadrant of the left breast. Ultrasound of the left breast on 02/16/17 showed an 8 mm mass at the 4 o'clock position. Axilla was negative. Biopsy on of the mass on 02/16/17 showed invasive mammary carcinoma, grade II. Receptor status was ER+ PR+ Ki67 15% and Her2 negative. Consensus recommendation at that time was to proceed with breast conservation surgery and sentinel lymph node biopsy followed by  4 - 6 1/2 weeks of adjuvant radiation therapy, likely 4 weeks based on current information at that time.  Since we saw her last, she has undergone seed localized left breast lumpectomy with deep left axillary sentinel node biopsy on 03/03/17.  Final pathology revealed invasive ductal  carcinoma of the left breast measuring 0.8cm, with negative margins and 3/3 nodes negative for disease. Her tumor remained ER/PR positive, HER-2 negative with Ki67 of 15%. She had a folow up visit with Dr. Lindi Adie on 03/10/17 and was not felt to need adjuvant chemotherapy.  The recommendation is to begin anti-estrogen therapy with Letrozole 2.5 mg daily following radiotherapy but she is not keen on this idea and is leaning towards forgoing antiestrogen therapy altogether. She will discuss this further with Dr. Lindi Adie once she completed radiotherapy.  She  presents today to discuss her surgical findings and further discuss the role for starting adjuvant radiotherapy.  PREVIOUS RADIATION THERAPY: No   PAST MEDICAL HISTORY:  Past Medical History:  Diagnosis Date  . Cancer ([employer])    breast Ca- L    . Heart murmur   . Hypertension   . Pneumonia    hosp.- as a child       PAST SURGICAL HISTORY: Past Surgical History:  Procedure Laterality Date  . BREAST LUMPECTOMY WITH RADIOACTIVE SEED AND SENTINEL LYMPH NODE BIOPSY Left 03/03/2017   Procedure: RADIOCATIVE SEED GUIDED LEFT BREAST LUMPECTOMY WITH LEFT AXILLARY SENTINEL LYMPH NODE BIOPSY;  Surgeon: Excell Seltzer, MD;  Location: [facility];  Service: General;  Laterality: Left;  . OOPHORECTOMY Right      FAMILY HISTORY:  Family History  Problem Relation Age of Onset  . Cancer Mother        melanoma  . Alzheimer's disease Mother   . Cancer Father        prostate  . Breast cancer Paternal Grandmother 89  . Breast cancer Cousin 66  . Cancer Paternal Aunt        lung  . Cancer Paternal Uncle        lung  . Cancer Paternal Grandfather        lung     SOCIAL HISTORY:  reports that  has never smoked. she has never used smokeless tobacco. She reports that she drinks about 4.2 oz of alcohol per week. She reports that she does not use drugs.   ALLERGIES: Codeine  MEDICATIONS:  Current Outpatient Medications  Medication Sig Dispense Refill  . acetaminophen (TYLENOL) 500 MG tablet Take 1,000 mg by mouth every 6 (six) hours as needed.    Marland Kitchen amLODipine (NORVASC) 10 MG tablet Take 10 mg by mouth daily.     No current facility-administered medications for this encounter.      REVIEW OF SYSTEMS: On review of systems, the patient reports that she is doing well overall. She reports left breast pain at the surgical incision and pain at the left axilla. She denies lymphedema issues. She has mild swelling around the left axillary incision but feels that this is gradually improving.  She  denies any chest pain, shortness of breath, cough, fevers, chills, night sweats, unintended weight changes. She denies any bowel or bladder disturbances, and denies abdominal pain, nausea or vomiting. She denies any new musculoskeletal or joint aches or pains. A complete review of systems is obtained and is otherwise negative.    PHYSICAL EXAM:  Wt Readings from Last 3 Encounters:  03/21/17 140 lb 3.2 oz (63.6 kg)  03/10/17 142 lb (64.4 kg)  03/03/17 139 lb (63 kg)   Temp Readings from Last 3 Encounters:  03/10/17 98 F (36.7 C) (Oral)  03/03/17 98.2 F (36.8 C)  03/01/17 97.6 F (36.4 C) (Oral)   BP Readings from Last 3 Encounters:  03/21/17 129/73  03/10/17 (!) 137/52  03/03/17 (!) 115/57   Pulse Readings from Last 3 Encounters:  03/21/17 79  03/10/17 78  03/03/17 73   Pain Assessment Pain Score: 0-No pain/10  In general this is a well appearing woman in no acute distress. She is alert and oriented x4 and appropriate throughout the examination. HEENT reveals that the patient is normocephalic, atraumatic. EOMs are intact. PERRLA. Skin is intact without any evidence of gross lesions. Cardiovascular exam reveals a regular rate and rhythm, no clicks rubs or murmurs are auscultated. Chest is clear to auscultation bilaterally. Lymphatic assessment is performed and does not reveal any adenopathy in the cervical, supraclavicular, axillary, or inguinal chains. Abdomen has active bowel sounds in all quadrants and is intact. The abdomen is soft, non tender, non distended. Lower extremities are negative for pretibial pitting edema, deep calf tenderness, cyanosis or clubbing. Two incisions, one in the left axillary region and one just along the border of the left areola appear well healed without signs of drainage or infection.  There is mild residual erythema and swelling along the axillary incision.  ECOG = 0  0 - Asymptomatic (Fully active, able to carry on all predisease activities without  restriction)  1 - Symptomatic but completely ambulatory (Restricted in physically strenuous activity but ambulatory and able to carry out work of a light or sedentary nature. For example, light housework, office work)  2 - Symptomatic, <50% in bed during the day (Ambulatory and capable of all self care but unable to carry out any work activities. Up and about more than 50% of waking hours)  3 - Symptomatic, >50% in bed, but not bedbound (Capable of only limited self-care, confined to bed or chair 50% or more of waking hours)  4 - Bedbound (Completely disabled. Cannot carry on any self-care. Totally confined to bed or chair)  5 - Death   Eustace Pen MM, Creech RH, Tormey DC, et al. [phone]). "Toxicity and response criteria of the [facility]". [place] Oncol. 5 (6): 649-55    LABORATORY DATA:  Lab Results  Component Value Date   WBC 6.8  03/01/2017   HGB 13.8 03/01/2017   HCT 41.5 03/01/2017   MCV 95.6 03/01/2017   PLT 326 03/01/2017   Lab Results  Component Value Date   NA 132 (L) 03/01/2017   K 4.2 03/01/2017   CL 95 (L) 03/01/2017   CO2 25 03/01/2017   Lab Results  Component Value Date   ALT 21 02/22/2017   AST 21 02/22/2017   ALKPHOS 63 02/22/2017   BILITOT 0.96 02/22/2017      RADIOGRAPHY: Nm Sentinel Node Inj-no Rpt (breast)  Result Date: 03/03/2017 Sulfur colloid was injected by the nuclear medicine technologist for melanoma sentinel node.       IMPRESSION/PLAN: 1. Stage IA, pT1bN0Mx, Grade I ER/PR (+), HER-2 (-) invasive ductal carcinoma of the left breast. Dr. Lisbeth Renshaw reviewed the pathology and discussed the rationale again for radiotherapy in her case. We discussed the fact that the use of radiation may decrease the risk of disease recurrence by up to two thirds. We discussed 4 weeks of treatment as an outpatient which she is interested in. We discussed the risks, benefits, short, and long-term effects of radiotherapy, and the patient is  interested in proceeding. Dr. Lisbeth Renshaw discussed the delivery and logistics of radiotherapy. We will move forward with scheduling CT simulation for planning purposes as her breast incisions are well-healed.  The patient freely signed written consent to proceed today in the office.  This document was placed in the medical record and a copy provided to the patient for her personal records. She will return for CT SIM on 03/28/17 at 10am in anticipation of beginning treatment the week of 04/03/17.  We will plan to use a breath hold technique to avoid exposure to the heart as her disease is left sided.    Nicholos Johns, PA-C  This document serves as a record of services personally performed by Kyung Rudd, MD and Freeman Caldron, PA-C. It was created on their behalf by Bethann Humble, a trained medical scribe. The creation of this record is based on the scribe's personal observations and the provider's statements to them. This document has been checked and approved by the attending provider.

## 2017-03-21 NOTE — Addendum Note (Signed)
Encounter addended by: Freeman Caldron, PA-C on: 03/21/2017 5:19 PM  Actions taken: Sign clinical note

## 2017-03-28 ENCOUNTER — Ambulatory Visit
Admission: RE | Admit: 2017-03-28 | Discharge: 2017-03-28 | Disposition: A | Discharge status: Home / Self Care | Payer: Medicare Other | Source: Ambulatory Visit | Attending: Radiation Oncology | Admitting: Radiation Oncology

## 2017-03-28 DIAGNOSIS — Z801 Family history of malignant neoplasm of trachea, bronchus and lung: Secondary | ICD-10-CM | POA: Diagnosis not present

## 2017-03-28 DIAGNOSIS — Z510 Encounter for antineoplastic radiation therapy: Secondary | ICD-10-CM | POA: Diagnosis not present

## 2017-03-28 DIAGNOSIS — Z803 Family history of malignant neoplasm of breast: Secondary | ICD-10-CM | POA: Diagnosis not present

## 2017-03-28 DIAGNOSIS — C50512 Malignant neoplasm of lower-outer quadrant of left female breast: Secondary | ICD-10-CM

## 2017-03-28 DIAGNOSIS — Z170 Estrogen receptor positive status [ER+]: Principal | ICD-10-CM

## 2017-03-28 DIAGNOSIS — I10 Essential (primary) hypertension: Secondary | ICD-10-CM | POA: Diagnosis not present

## 2017-03-28 NOTE — Progress Notes (Signed)
  Radiation Oncology         [phone] ________________________________  Name: Nichole Ortega MRN: [id number]  Date: 03/28/2017  DOB: [birth year]   DIAGNOSIS:     ICD-10-CM   1. Malignant neoplasm of lower-outer quadrant of left breast of female, estrogen receptor positive ([place]) C50.512    Z17.0     SIMULATION AND TREATMENT PLANNING NOTE  The patient presented for simulation prior to beginning her course of radiation treatment for her diagnosis of left-sided breast cancer. The patient was placed in a supine position on a breast board. A customized vac-lock bag was constructed and this complex treatment device will be used on a daily basis during her treatment. In this fashion, a CT scan was obtained through the chest area and an isocenter was placed near the chest wall within the breast.  The patient will be planned to receive a course of radiation initially to a dose of 42.5 Gy. This will consist of a whole breast radiotherapy technique. To accomplish this, 2 customized blocks have been designed which will correspond to medial and lateral whole breast tangent fields. This treatment will be accomplished at 2.5 Gy per fraction. A forward planning technique will also be evaluated to determine if this approach improves the plan. It is anticipated that the patient will then receive a 7.5 Gy boost to the seroma cavity which has been contoured. This will be accomplished at 2.5 Gy per fraction.   This initial treatment will consist of a 3-D conformal technique. The seroma has been contoured as the primary target structure. Additionally, dose volume histograms of both this target as well as the lungs and heart will also be evaluated. Such an approach is necessary to ensure that the target area is adequately covered while the nearby critical  normal structures are adequately spared.  Plan:  The final anticipated total dose therefore will correspond to 50 Gy.   Special treatment procedure was  performed today due to the extra time and effort required by myself to plan and prepare this patient for deep inspiration breath hold technique.  I have determined cardiac sparing to be of benefit to this patient to prevent long term cardiac damage due to radiation of the heart.  Bellows were placed on the patient's abdomen. To facilitate cardiac sparing, the patient was coached by the radiation therapists on breath hold techniques and breathing practice was performed. Practice waveforms were obtained. The patient was then scanned while maintaining breath hold in the treatment position.  This image was then transferred over to the imaging specialist. The imaging specialist then created a fusion of the free breathing and breath hold scans using the chest wall as the stable structure. I personally reviewed the fusion in axial, coronal and sagittal image planes.  Excellent cardiac sparing was obtained.  I felt the patient is an appropriate candidate for breath hold and the patient will be treated as such.  The image fusion was then reviewed with the patient to reinforce the necessity of reproducible breath hold.    _______________________________   Jodelle Gross, MD, PhD

## 2017-03-28 NOTE — Progress Notes (Signed)
  Radiation Oncology         [phone] ________________________________  Name: Nichole Ortega MRN: [id number]  Date: 03/28/2017  DOB: 06/27/1950  Optical Surface Tracking Plan:  Since intensity modulated radiotherapy (IMRT) and 3D conformal radiation treatment methods are predicated on accurate and precise positioning for treatment, intrafraction motion monitoring is medically necessary to ensure accurate and safe treatment delivery.  The ability to quantify intrafraction motion without excessive ionizing radiation dose can only be performed with optical surface tracking. Accordingly, surface imaging offers the opportunity to obtain 3D measurements of patient position throughout IMRT and 3D treatments without excessive radiation exposure.  I am ordering optical surface tracking for this patient's upcoming course of radiotherapy. ________________________________  Kyung Rudd, MD 03/28/2017 3:14 PM    Reference:   Particia Jasper, et al. Surface imaging-based analysis of intrafraction motion for breast radiotherapy patients.Journal of [employer], n. 6, nov. 2014. ISSN [id number].   Available at: <http://www.jacmp.org/index.php/jacmp/article/view/4957>.

## 2017-03-30 ENCOUNTER — Telehealth: Payer: Self-pay | Admitting: Hematology and Oncology

## 2017-03-30 NOTE — Telephone Encounter (Signed)
Spoke to patient regarding upcoming February appointments per 1/22 sch message.

## 2017-04-04 ENCOUNTER — Ambulatory Visit
Admission: RE | Admit: 2017-04-04 | Discharge: 2017-04-04 | Disposition: A | Discharge status: Home / Self Care | Payer: Medicare Other | Source: Ambulatory Visit | Attending: Radiation Oncology | Admitting: Radiation Oncology

## 2017-04-04 DIAGNOSIS — C50512 Malignant neoplasm of lower-outer quadrant of left female breast: Secondary | ICD-10-CM | POA: Diagnosis not present

## 2017-04-04 DIAGNOSIS — Z170 Estrogen receptor positive status [ER+]: Secondary | ICD-10-CM | POA: Diagnosis not present

## 2017-04-04 DIAGNOSIS — Z801 Family history of malignant neoplasm of trachea, bronchus and lung: Secondary | ICD-10-CM | POA: Diagnosis not present

## 2017-04-04 DIAGNOSIS — Z510 Encounter for antineoplastic radiation therapy: Secondary | ICD-10-CM | POA: Diagnosis not present

## 2017-04-04 DIAGNOSIS — I10 Essential (primary) hypertension: Secondary | ICD-10-CM | POA: Diagnosis not present

## 2017-04-04 DIAGNOSIS — Z803 Family history of malignant neoplasm of breast: Secondary | ICD-10-CM | POA: Diagnosis not present

## 2017-04-05 ENCOUNTER — Ambulatory Visit
Admission: RE | Admit: 2017-04-05 | Discharge: 2017-04-05 | Disposition: A | Discharge status: Home / Self Care | Payer: Medicare Other | Source: Ambulatory Visit | Attending: Radiation Oncology | Admitting: Radiation Oncology

## 2017-04-05 DIAGNOSIS — Z170 Estrogen receptor positive status [ER+]: Secondary | ICD-10-CM | POA: Diagnosis not present

## 2017-04-05 DIAGNOSIS — Z803 Family history of malignant neoplasm of breast: Secondary | ICD-10-CM | POA: Diagnosis not present

## 2017-04-05 DIAGNOSIS — Z510 Encounter for antineoplastic radiation therapy: Secondary | ICD-10-CM | POA: Diagnosis not present

## 2017-04-05 DIAGNOSIS — Z801 Family history of malignant neoplasm of trachea, bronchus and lung: Secondary | ICD-10-CM | POA: Diagnosis not present

## 2017-04-05 DIAGNOSIS — C50512 Malignant neoplasm of lower-outer quadrant of left female breast: Secondary | ICD-10-CM | POA: Diagnosis not present

## 2017-04-05 DIAGNOSIS — I10 Essential (primary) hypertension: Secondary | ICD-10-CM | POA: Diagnosis not present

## 2017-04-06 ENCOUNTER — Ambulatory Visit
Admission: RE | Admit: 2017-04-06 | Discharge: 2017-04-06 | Disposition: A | Discharge status: Home / Self Care | Payer: Medicare Other | Source: Ambulatory Visit | Attending: Radiation Oncology | Admitting: Radiation Oncology

## 2017-04-06 DIAGNOSIS — Z801 Family history of malignant neoplasm of trachea, bronchus and lung: Secondary | ICD-10-CM | POA: Diagnosis not present

## 2017-04-06 DIAGNOSIS — Z170 Estrogen receptor positive status [ER+]: Secondary | ICD-10-CM | POA: Diagnosis not present

## 2017-04-06 DIAGNOSIS — Z803 Family history of malignant neoplasm of breast: Secondary | ICD-10-CM | POA: Diagnosis not present

## 2017-04-06 DIAGNOSIS — Z510 Encounter for antineoplastic radiation therapy: Secondary | ICD-10-CM | POA: Diagnosis not present

## 2017-04-06 DIAGNOSIS — C50512 Malignant neoplasm of lower-outer quadrant of left female breast: Secondary | ICD-10-CM | POA: Diagnosis not present

## 2017-04-06 DIAGNOSIS — I10 Essential (primary) hypertension: Secondary | ICD-10-CM | POA: Diagnosis not present

## 2017-04-07 ENCOUNTER — Ambulatory Visit
Admission: RE | Admit: 2017-04-07 | Discharge: 2017-04-07 | Disposition: A | Discharge status: Home / Self Care | Payer: Medicare Other | Source: Ambulatory Visit | Attending: Radiation Oncology | Admitting: Radiation Oncology

## 2017-04-07 DIAGNOSIS — C50512 Malignant neoplasm of lower-outer quadrant of left female breast: Secondary | ICD-10-CM | POA: Diagnosis not present

## 2017-04-07 DIAGNOSIS — Z801 Family history of malignant neoplasm of trachea, bronchus and lung: Secondary | ICD-10-CM | POA: Diagnosis not present

## 2017-04-07 DIAGNOSIS — Z510 Encounter for antineoplastic radiation therapy: Secondary | ICD-10-CM | POA: Diagnosis not present

## 2017-04-07 DIAGNOSIS — I10 Essential (primary) hypertension: Secondary | ICD-10-CM | POA: Diagnosis not present

## 2017-04-07 DIAGNOSIS — Z170 Estrogen receptor positive status [ER+]: Secondary | ICD-10-CM | POA: Diagnosis not present

## 2017-04-07 DIAGNOSIS — Z803 Family history of malignant neoplasm of breast: Secondary | ICD-10-CM | POA: Diagnosis not present

## 2017-04-10 ENCOUNTER — Ambulatory Visit
Admission: RE | Admit: 2017-04-10 | Discharge: 2017-04-10 | Disposition: A | Discharge status: Home / Self Care | Payer: Medicare Other | Source: Ambulatory Visit | Attending: Radiation Oncology | Admitting: Radiation Oncology

## 2017-04-10 DIAGNOSIS — Z170 Estrogen receptor positive status [ER+]: Secondary | ICD-10-CM | POA: Diagnosis not present

## 2017-04-10 DIAGNOSIS — C50512 Malignant neoplasm of lower-outer quadrant of left female breast: Secondary | ICD-10-CM

## 2017-04-10 DIAGNOSIS — Z510 Encounter for antineoplastic radiation therapy: Secondary | ICD-10-CM | POA: Diagnosis not present

## 2017-04-10 DIAGNOSIS — Z803 Family history of malignant neoplasm of breast: Secondary | ICD-10-CM | POA: Diagnosis not present

## 2017-04-10 DIAGNOSIS — Z801 Family history of malignant neoplasm of trachea, bronchus and lung: Secondary | ICD-10-CM | POA: Diagnosis not present

## 2017-04-10 DIAGNOSIS — I10 Essential (primary) hypertension: Secondary | ICD-10-CM | POA: Diagnosis not present

## 2017-04-10 MED ORDER — ALRA NON-METALLIC DEODORANT (RAD-ONC)
1.0000 "application " | Freq: Once | TOPICAL | Status: AC
  Administered 2017-04-10: 1 via TOPICAL

## 2017-04-10 MED ORDER — RADIAPLEXRX EX GEL
Freq: Two times a day (BID) | CUTANEOUS | Status: DC
  Administered 2017-04-10: 16:00:00 via TOPICAL

## 2017-04-10 NOTE — Progress Notes (Signed)
Pt here for patient teaching.  Pt given Radiation and You booklet, skin care instructions, Alra deodorant and Radiaplex gel.  Reviewed areas of pertinence such as fatigue, skin changes, breast tenderness and breast swelling . Pt able to give teach back of to pat skin and sitz bath,apply Radiaplex bid, avoid applying anything to skin within 4 hours of treatment, avoid wearing an under wire bra and to use an electric razor if they must shave. Pt verbalizes understanding of information given and will contact nursing with any questions or concerns.     Http://rtanswers.org/treatmentinformation/whattoexpect/index

## 2017-04-11 ENCOUNTER — Ambulatory Visit
Admission: RE | Admit: 2017-04-11 | Discharge: 2017-04-11 | Disposition: A | Discharge status: Home / Self Care | Payer: Medicare Other | Source: Ambulatory Visit | Attending: Radiation Oncology | Admitting: Radiation Oncology

## 2017-04-11 DIAGNOSIS — Z170 Estrogen receptor positive status [ER+]: Secondary | ICD-10-CM | POA: Diagnosis not present

## 2017-04-11 DIAGNOSIS — I10 Essential (primary) hypertension: Secondary | ICD-10-CM | POA: Diagnosis not present

## 2017-04-11 DIAGNOSIS — Z803 Family history of malignant neoplasm of breast: Secondary | ICD-10-CM | POA: Diagnosis not present

## 2017-04-11 DIAGNOSIS — Z510 Encounter for antineoplastic radiation therapy: Secondary | ICD-10-CM | POA: Diagnosis not present

## 2017-04-11 DIAGNOSIS — C50512 Malignant neoplasm of lower-outer quadrant of left female breast: Secondary | ICD-10-CM | POA: Diagnosis not present

## 2017-04-11 DIAGNOSIS — Z801 Family history of malignant neoplasm of trachea, bronchus and lung: Secondary | ICD-10-CM | POA: Diagnosis not present

## 2017-04-12 ENCOUNTER — Ambulatory Visit
Admission: RE | Admit: 2017-04-12 | Discharge: 2017-04-12 | Disposition: A | Discharge status: Home / Self Care | Payer: Medicare Other | Source: Ambulatory Visit | Attending: Radiation Oncology | Admitting: Radiation Oncology

## 2017-04-12 DIAGNOSIS — Z510 Encounter for antineoplastic radiation therapy: Secondary | ICD-10-CM | POA: Diagnosis not present

## 2017-04-12 DIAGNOSIS — C50512 Malignant neoplasm of lower-outer quadrant of left female breast: Secondary | ICD-10-CM | POA: Diagnosis not present

## 2017-04-12 DIAGNOSIS — I10 Essential (primary) hypertension: Secondary | ICD-10-CM | POA: Diagnosis not present

## 2017-04-12 DIAGNOSIS — Z803 Family history of malignant neoplasm of breast: Secondary | ICD-10-CM | POA: Diagnosis not present

## 2017-04-12 DIAGNOSIS — Z801 Family history of malignant neoplasm of trachea, bronchus and lung: Secondary | ICD-10-CM | POA: Diagnosis not present

## 2017-04-12 DIAGNOSIS — Z170 Estrogen receptor positive status [ER+]: Secondary | ICD-10-CM | POA: Diagnosis not present

## 2017-04-13 ENCOUNTER — Ambulatory Visit
Admission: RE | Admit: 2017-04-13 | Discharge: 2017-04-13 | Disposition: A | Discharge status: Home / Self Care | Payer: Medicare Other | Source: Ambulatory Visit | Attending: Radiation Oncology | Admitting: Radiation Oncology

## 2017-04-13 DIAGNOSIS — Z803 Family history of malignant neoplasm of breast: Secondary | ICD-10-CM | POA: Diagnosis not present

## 2017-04-13 DIAGNOSIS — Z801 Family history of malignant neoplasm of trachea, bronchus and lung: Secondary | ICD-10-CM | POA: Diagnosis not present

## 2017-04-13 DIAGNOSIS — I10 Essential (primary) hypertension: Secondary | ICD-10-CM | POA: Diagnosis not present

## 2017-04-13 DIAGNOSIS — Z170 Estrogen receptor positive status [ER+]: Secondary | ICD-10-CM | POA: Diagnosis not present

## 2017-04-13 DIAGNOSIS — C50512 Malignant neoplasm of lower-outer quadrant of left female breast: Secondary | ICD-10-CM | POA: Diagnosis not present

## 2017-04-13 DIAGNOSIS — Z510 Encounter for antineoplastic radiation therapy: Secondary | ICD-10-CM | POA: Diagnosis not present

## 2017-04-14 ENCOUNTER — Ambulatory Visit
Admission: RE | Admit: 2017-04-14 | Discharge: 2017-04-14 | Disposition: A | Discharge status: Home / Self Care | Payer: Medicare Other | Source: Ambulatory Visit | Attending: Radiation Oncology | Admitting: Radiation Oncology

## 2017-04-14 DIAGNOSIS — C50512 Malignant neoplasm of lower-outer quadrant of left female breast: Secondary | ICD-10-CM | POA: Diagnosis not present

## 2017-04-14 DIAGNOSIS — I10 Essential (primary) hypertension: Secondary | ICD-10-CM | POA: Diagnosis not present

## 2017-04-14 DIAGNOSIS — Z803 Family history of malignant neoplasm of breast: Secondary | ICD-10-CM | POA: Diagnosis not present

## 2017-04-14 DIAGNOSIS — Z801 Family history of malignant neoplasm of trachea, bronchus and lung: Secondary | ICD-10-CM | POA: Diagnosis not present

## 2017-04-14 DIAGNOSIS — Z510 Encounter for antineoplastic radiation therapy: Secondary | ICD-10-CM | POA: Diagnosis not present

## 2017-04-14 DIAGNOSIS — Z170 Estrogen receptor positive status [ER+]: Secondary | ICD-10-CM | POA: Diagnosis not present

## 2017-04-17 ENCOUNTER — Ambulatory Visit
Admission: RE | Admit: 2017-04-17 | Discharge: 2017-04-17 | Disposition: A | Discharge status: Home / Self Care | Payer: Medicare Other | Source: Ambulatory Visit | Attending: Radiation Oncology | Admitting: Radiation Oncology

## 2017-04-17 DIAGNOSIS — C50512 Malignant neoplasm of lower-outer quadrant of left female breast: Secondary | ICD-10-CM | POA: Diagnosis not present

## 2017-04-17 DIAGNOSIS — I10 Essential (primary) hypertension: Secondary | ICD-10-CM | POA: Diagnosis not present

## 2017-04-17 DIAGNOSIS — Z510 Encounter for antineoplastic radiation therapy: Secondary | ICD-10-CM | POA: Diagnosis not present

## 2017-04-17 DIAGNOSIS — Z803 Family history of malignant neoplasm of breast: Secondary | ICD-10-CM | POA: Diagnosis not present

## 2017-04-17 DIAGNOSIS — Z170 Estrogen receptor positive status [ER+]: Secondary | ICD-10-CM | POA: Diagnosis not present

## 2017-04-17 DIAGNOSIS — Z801 Family history of malignant neoplasm of trachea, bronchus and lung: Secondary | ICD-10-CM | POA: Diagnosis not present

## 2017-04-18 ENCOUNTER — Ambulatory Visit
Admission: RE | Admit: 2017-04-18 | Discharge: 2017-04-18 | Disposition: A | Discharge status: Home / Self Care | Payer: Medicare Other | Source: Ambulatory Visit | Attending: Radiation Oncology | Admitting: Radiation Oncology

## 2017-04-18 DIAGNOSIS — C50512 Malignant neoplasm of lower-outer quadrant of left female breast: Secondary | ICD-10-CM | POA: Diagnosis not present

## 2017-04-18 DIAGNOSIS — Z510 Encounter for antineoplastic radiation therapy: Secondary | ICD-10-CM | POA: Diagnosis not present

## 2017-04-18 DIAGNOSIS — Z170 Estrogen receptor positive status [ER+]: Secondary | ICD-10-CM | POA: Diagnosis not present

## 2017-04-18 DIAGNOSIS — Z803 Family history of malignant neoplasm of breast: Secondary | ICD-10-CM | POA: Diagnosis not present

## 2017-04-18 DIAGNOSIS — I10 Essential (primary) hypertension: Secondary | ICD-10-CM | POA: Diagnosis not present

## 2017-04-18 DIAGNOSIS — Z801 Family history of malignant neoplasm of trachea, bronchus and lung: Secondary | ICD-10-CM | POA: Diagnosis not present

## 2017-04-19 ENCOUNTER — Ambulatory Visit
Admission: RE | Admit: 2017-04-19 | Discharge: 2017-04-19 | Disposition: A | Discharge status: Home / Self Care | Payer: Medicare Other | Source: Ambulatory Visit | Attending: Radiation Oncology | Admitting: Radiation Oncology

## 2017-04-19 DIAGNOSIS — Z170 Estrogen receptor positive status [ER+]: Secondary | ICD-10-CM | POA: Diagnosis not present

## 2017-04-19 DIAGNOSIS — C50512 Malignant neoplasm of lower-outer quadrant of left female breast: Secondary | ICD-10-CM | POA: Diagnosis not present

## 2017-04-19 DIAGNOSIS — Z803 Family history of malignant neoplasm of breast: Secondary | ICD-10-CM | POA: Diagnosis not present

## 2017-04-19 DIAGNOSIS — I10 Essential (primary) hypertension: Secondary | ICD-10-CM | POA: Diagnosis not present

## 2017-04-19 DIAGNOSIS — Z801 Family history of malignant neoplasm of trachea, bronchus and lung: Secondary | ICD-10-CM | POA: Diagnosis not present

## 2017-04-19 DIAGNOSIS — Z510 Encounter for antineoplastic radiation therapy: Secondary | ICD-10-CM | POA: Diagnosis not present

## 2017-04-20 ENCOUNTER — Ambulatory Visit
Admission: RE | Admit: 2017-04-20 | Discharge: 2017-04-20 | Disposition: A | Discharge status: Home / Self Care | Payer: Medicare Other | Source: Ambulatory Visit | Attending: Radiation Oncology | Admitting: Radiation Oncology

## 2017-04-20 ENCOUNTER — Encounter: Payer: Medicare Other | Admitting: Radiation Oncology

## 2017-04-20 DIAGNOSIS — Z801 Family history of malignant neoplasm of trachea, bronchus and lung: Secondary | ICD-10-CM | POA: Diagnosis not present

## 2017-04-20 DIAGNOSIS — Z803 Family history of malignant neoplasm of breast: Secondary | ICD-10-CM | POA: Diagnosis not present

## 2017-04-20 DIAGNOSIS — I10 Essential (primary) hypertension: Secondary | ICD-10-CM | POA: Diagnosis not present

## 2017-04-20 DIAGNOSIS — C50512 Malignant neoplasm of lower-outer quadrant of left female breast: Secondary | ICD-10-CM | POA: Diagnosis not present

## 2017-04-20 DIAGNOSIS — Z510 Encounter for antineoplastic radiation therapy: Secondary | ICD-10-CM | POA: Diagnosis not present

## 2017-04-20 DIAGNOSIS — Z170 Estrogen receptor positive status [ER+]: Secondary | ICD-10-CM | POA: Diagnosis not present

## 2017-04-21 ENCOUNTER — Ambulatory Visit
Admission: RE | Admit: 2017-04-21 | Discharge: 2017-04-21 | Disposition: A | Discharge status: Home / Self Care | Payer: Medicare Other | Source: Ambulatory Visit | Attending: Radiation Oncology | Admitting: Radiation Oncology

## 2017-04-21 ENCOUNTER — Ambulatory Visit: Payer: Medicare Other | Admitting: Radiation Oncology

## 2017-04-21 DIAGNOSIS — C50512 Malignant neoplasm of lower-outer quadrant of left female breast: Secondary | ICD-10-CM | POA: Diagnosis not present

## 2017-04-21 DIAGNOSIS — Z803 Family history of malignant neoplasm of breast: Secondary | ICD-10-CM | POA: Diagnosis not present

## 2017-04-21 DIAGNOSIS — I10 Essential (primary) hypertension: Secondary | ICD-10-CM | POA: Diagnosis not present

## 2017-04-21 DIAGNOSIS — Z510 Encounter for antineoplastic radiation therapy: Secondary | ICD-10-CM | POA: Diagnosis not present

## 2017-04-21 DIAGNOSIS — Z801 Family history of malignant neoplasm of trachea, bronchus and lung: Secondary | ICD-10-CM | POA: Diagnosis not present

## 2017-04-21 DIAGNOSIS — Z170 Estrogen receptor positive status [ER+]: Secondary | ICD-10-CM | POA: Diagnosis not present

## 2017-04-24 ENCOUNTER — Ambulatory Visit
Admission: RE | Admit: 2017-04-24 | Discharge: 2017-04-24 | Disposition: A | Discharge status: Home / Self Care | Payer: Medicare Other | Source: Ambulatory Visit | Attending: Radiation Oncology | Admitting: Radiation Oncology

## 2017-04-24 DIAGNOSIS — Z510 Encounter for antineoplastic radiation therapy: Secondary | ICD-10-CM | POA: Diagnosis not present

## 2017-04-24 DIAGNOSIS — I10 Essential (primary) hypertension: Secondary | ICD-10-CM | POA: Diagnosis not present

## 2017-04-24 DIAGNOSIS — C50512 Malignant neoplasm of lower-outer quadrant of left female breast: Secondary | ICD-10-CM | POA: Diagnosis not present

## 2017-04-24 DIAGNOSIS — Z801 Family history of malignant neoplasm of trachea, bronchus and lung: Secondary | ICD-10-CM | POA: Diagnosis not present

## 2017-04-24 DIAGNOSIS — Z803 Family history of malignant neoplasm of breast: Secondary | ICD-10-CM | POA: Diagnosis not present

## 2017-04-24 DIAGNOSIS — Z170 Estrogen receptor positive status [ER+]: Secondary | ICD-10-CM | POA: Diagnosis not present

## 2017-04-25 ENCOUNTER — Ambulatory Visit
Admission: RE | Admit: 2017-04-25 | Discharge: 2017-04-25 | Disposition: A | Discharge status: Home / Self Care | Payer: Medicare Other | Source: Ambulatory Visit | Attending: Radiation Oncology | Admitting: Radiation Oncology

## 2017-04-25 DIAGNOSIS — C50512 Malignant neoplasm of lower-outer quadrant of left female breast: Secondary | ICD-10-CM | POA: Diagnosis not present

## 2017-04-25 DIAGNOSIS — Z510 Encounter for antineoplastic radiation therapy: Secondary | ICD-10-CM | POA: Diagnosis not present

## 2017-04-25 DIAGNOSIS — Z170 Estrogen receptor positive status [ER+]: Secondary | ICD-10-CM | POA: Diagnosis not present

## 2017-04-25 DIAGNOSIS — Z801 Family history of malignant neoplasm of trachea, bronchus and lung: Secondary | ICD-10-CM | POA: Diagnosis not present

## 2017-04-25 DIAGNOSIS — Z803 Family history of malignant neoplasm of breast: Secondary | ICD-10-CM | POA: Diagnosis not present

## 2017-04-25 DIAGNOSIS — I10 Essential (primary) hypertension: Secondary | ICD-10-CM | POA: Diagnosis not present

## 2017-04-26 ENCOUNTER — Ambulatory Visit
Admission: RE | Admit: 2017-04-26 | Discharge: 2017-04-26 | Disposition: A | Discharge status: Home / Self Care | Payer: Medicare Other | Source: Ambulatory Visit | Attending: Radiation Oncology | Admitting: Radiation Oncology

## 2017-04-26 DIAGNOSIS — Z510 Encounter for antineoplastic radiation therapy: Secondary | ICD-10-CM | POA: Diagnosis not present

## 2017-04-26 DIAGNOSIS — Z801 Family history of malignant neoplasm of trachea, bronchus and lung: Secondary | ICD-10-CM | POA: Diagnosis not present

## 2017-04-26 DIAGNOSIS — I10 Essential (primary) hypertension: Secondary | ICD-10-CM | POA: Diagnosis not present

## 2017-04-26 DIAGNOSIS — Z170 Estrogen receptor positive status [ER+]: Secondary | ICD-10-CM | POA: Diagnosis not present

## 2017-04-26 DIAGNOSIS — C50512 Malignant neoplasm of lower-outer quadrant of left female breast: Secondary | ICD-10-CM | POA: Diagnosis not present

## 2017-04-26 DIAGNOSIS — Z803 Family history of malignant neoplasm of breast: Secondary | ICD-10-CM | POA: Diagnosis not present

## 2017-04-27 ENCOUNTER — Ambulatory Visit
Admission: RE | Admit: 2017-04-27 | Discharge: 2017-04-27 | Disposition: A | Discharge status: Home / Self Care | Payer: Medicare Other | Source: Ambulatory Visit | Attending: Radiation Oncology | Admitting: Radiation Oncology

## 2017-04-27 DIAGNOSIS — Z510 Encounter for antineoplastic radiation therapy: Secondary | ICD-10-CM | POA: Diagnosis not present

## 2017-04-27 DIAGNOSIS — Z801 Family history of malignant neoplasm of trachea, bronchus and lung: Secondary | ICD-10-CM | POA: Diagnosis not present

## 2017-04-27 DIAGNOSIS — Z170 Estrogen receptor positive status [ER+]: Secondary | ICD-10-CM | POA: Diagnosis not present

## 2017-04-27 DIAGNOSIS — Z803 Family history of malignant neoplasm of breast: Secondary | ICD-10-CM | POA: Diagnosis not present

## 2017-04-27 DIAGNOSIS — C50512 Malignant neoplasm of lower-outer quadrant of left female breast: Secondary | ICD-10-CM | POA: Diagnosis not present

## 2017-04-27 DIAGNOSIS — I10 Essential (primary) hypertension: Secondary | ICD-10-CM | POA: Diagnosis not present

## 2017-04-28 ENCOUNTER — Ambulatory Visit
Admission: RE | Admit: 2017-04-28 | Discharge: 2017-04-28 | Disposition: A | Discharge status: Home / Self Care | Payer: Medicare Other | Source: Ambulatory Visit | Attending: Radiation Oncology | Admitting: Radiation Oncology

## 2017-04-28 DIAGNOSIS — I10 Essential (primary) hypertension: Secondary | ICD-10-CM | POA: Diagnosis not present

## 2017-04-28 DIAGNOSIS — Z510 Encounter for antineoplastic radiation therapy: Secondary | ICD-10-CM | POA: Diagnosis not present

## 2017-04-28 DIAGNOSIS — Z801 Family history of malignant neoplasm of trachea, bronchus and lung: Secondary | ICD-10-CM | POA: Diagnosis not present

## 2017-04-28 DIAGNOSIS — Z803 Family history of malignant neoplasm of breast: Secondary | ICD-10-CM | POA: Diagnosis not present

## 2017-04-28 DIAGNOSIS — Z170 Estrogen receptor positive status [ER+]: Secondary | ICD-10-CM | POA: Diagnosis not present

## 2017-04-28 DIAGNOSIS — C50512 Malignant neoplasm of lower-outer quadrant of left female breast: Secondary | ICD-10-CM | POA: Diagnosis not present

## 2017-05-01 ENCOUNTER — Ambulatory Visit
Admission: RE | Admit: 2017-05-01 | Discharge: 2017-05-01 | Disposition: A | Discharge status: Home / Self Care | Payer: Medicare Other | Source: Ambulatory Visit | Attending: Radiation Oncology | Admitting: Radiation Oncology

## 2017-05-01 DIAGNOSIS — Z170 Estrogen receptor positive status [ER+]: Secondary | ICD-10-CM | POA: Diagnosis not present

## 2017-05-01 DIAGNOSIS — Z801 Family history of malignant neoplasm of trachea, bronchus and lung: Secondary | ICD-10-CM | POA: Diagnosis not present

## 2017-05-01 DIAGNOSIS — C50512 Malignant neoplasm of lower-outer quadrant of left female breast: Secondary | ICD-10-CM | POA: Diagnosis not present

## 2017-05-01 DIAGNOSIS — Z803 Family history of malignant neoplasm of breast: Secondary | ICD-10-CM | POA: Diagnosis not present

## 2017-05-01 DIAGNOSIS — I10 Essential (primary) hypertension: Secondary | ICD-10-CM | POA: Diagnosis not present

## 2017-05-01 DIAGNOSIS — Z510 Encounter for antineoplastic radiation therapy: Secondary | ICD-10-CM | POA: Diagnosis not present

## 2017-05-02 ENCOUNTER — Telehealth: Payer: Self-pay | Admitting: Hematology and Oncology

## 2017-05-02 ENCOUNTER — Encounter: Payer: Self-pay | Admitting: Radiation Oncology

## 2017-05-02 ENCOUNTER — Inpatient Hospital Stay: Payer: Medicare Other | Attending: Hematology and Oncology | Admitting: Hematology and Oncology

## 2017-05-02 ENCOUNTER — Ambulatory Visit
Admission: RE | Admit: 2017-05-02 | Discharge: 2017-05-02 | Disposition: A | Discharge status: Home / Self Care | Payer: Medicare Other | Source: Ambulatory Visit | Attending: Radiation Oncology | Admitting: Radiation Oncology

## 2017-05-02 DIAGNOSIS — Z923 Personal history of irradiation: Secondary | ICD-10-CM | POA: Diagnosis not present

## 2017-05-02 DIAGNOSIS — Z79811 Long term (current) use of aromatase inhibitors: Secondary | ICD-10-CM | POA: Diagnosis not present

## 2017-05-02 DIAGNOSIS — Z170 Estrogen receptor positive status [ER+]: Secondary | ICD-10-CM

## 2017-05-02 DIAGNOSIS — Z803 Family history of malignant neoplasm of breast: Secondary | ICD-10-CM | POA: Diagnosis not present

## 2017-05-02 DIAGNOSIS — I10 Essential (primary) hypertension: Secondary | ICD-10-CM | POA: Diagnosis not present

## 2017-05-02 DIAGNOSIS — C50512 Malignant neoplasm of lower-outer quadrant of left female breast: Secondary | ICD-10-CM | POA: Diagnosis not present

## 2017-05-02 DIAGNOSIS — Z510 Encounter for antineoplastic radiation therapy: Secondary | ICD-10-CM | POA: Diagnosis not present

## 2017-05-02 DIAGNOSIS — Z801 Family history of malignant neoplasm of trachea, bronchus and lung: Secondary | ICD-10-CM | POA: Diagnosis not present

## 2017-05-02 NOTE — Telephone Encounter (Signed)
Patient declined avs and calendar of upcoming appointment. Patient will receive update on mychart.

## 2017-05-02 NOTE — Assessment & Plan Note (Signed)
03/03/18: Left Lumpectomy:0.8 cm IDC grade 1, 0/3 LN neg,  ER 100%, PR 100%, Ki-67 15%, HER-2 negative ratio 1.58 T1bN0 Stage 1A Adjuvant radiation 04/05/2017-05/02/2017  Treatment plan: Adjuvant antiestrogen therapy with letrozole 2.5 mg daily Letrozole counseling: We discussed the risks and benefits of anti-estrogen therapy with aromatase inhibitors. These include but not limited to insomnia, hot flashes, mood changes, vaginal dryness, bone density loss, and weight gain. We strongly believe that the benefits far outweigh the risks. Patient understands these risks and consented to starting treatment. Planned treatment duration is 5 years.  Return to clinic in 3 months for survivorship care plan visit

## 2017-05-02 NOTE — Progress Notes (Signed)
Patient Care Team: Jani Gravel, MD as PCP - General (Internal Medicine)  DIAGNOSIS:  Encounter Diagnosis  Name Primary?  . Malignant neoplasm of lower-outer quadrant of left breast of female, estrogen receptor positive ([place])     SUMMARY OF ONCOLOGIC HISTORY:   Malignant neoplasm of lower-outer quadrant of left breast of female, estrogen receptor positive ([place])   02/16/2017 Initial Diagnosis    Screening detected left breast mass 8 mm at 4 o'clock position axilla negative biopsy grade 2 invasive ductal carcinoma ER 100%, PR 100%, Ki-67 15%, HER-2 negative ratio 1.58, T1 BN 0 stage I a clinical stage      03/03/2017 Surgery    Left Lumpectomy:0.8 cm IDC grade 1, 0/3 LN neg,  ER 100%, PR 100%, Ki-67 15%, HER-2 negative ratio 1.58 T1bN0 Stage 1A      04/05/2017 - 05/02/2017 Radiation Therapy    Adjuvant radiation therapy       CHIEF COMPLIANT: Follow-up after radiation therapy  INTERVAL HISTORY: Nichole Ortega is a 67 year old with above-mentioned history of left breast cancer underwent lumpectomy and radiation is here today to discuss adjuvant therapy.  She is really done well with radiation.  She does have mild to moderate fatigue.  She has not made up her mind regarding antiestrogen therapy.  She still had multiple questions regarding tamoxifen versus letrozole.  REVIEW OF SYSTEMS:   Constitutional: Denies fevers, chills or abnormal weight loss Eyes: Denies blurriness of vision Ears, nose, mouth, throat, and face: Denies mucositis or sore throat Respiratory: Denies cough, dyspnea or wheezes Cardiovascular: Denies palpitation, chest discomfort Gastrointestinal:  Denies nausea, heartburn or change in bowel habits Skin: Denies abnormal skin rashes Lymphatics: Denies new lymphadenopathy or easy bruising Neurological:Denies numbness, tingling or new weaknesses Behavioral/Psych: Mood is stable, no new changes  Extremities: No lower extremity edema Breast: Adjuvant radiation All  other systems were reviewed with the patient and are negative.  I have reviewed the past medical history, past surgical history, social history and family history with the patient and they are unchanged from previous note.  ALLERGIES:  is allergic to codeine.  MEDICATIONS:  Current Outpatient Medications  Medication Sig Dispense Refill  . acetaminophen (TYLENOL) 500 MG tablet Take 1,000 mg by mouth every 6 (six) hours as needed.    Marland Kitchen amLODipine (NORVASC) 10 MG tablet Take 10 mg by mouth daily.     No current facility-administered medications for this visit.     PHYSICAL EXAMINATION: ECOG PERFORMANCE STATUS: 1 - Symptomatic but completely ambulatory  Vitals:   05/02/17 1504  BP: 116/60  Pulse: 80  Resp: 20  Temp: 98.4 F (36.9 C)  SpO2: 98%   Filed Weights   05/02/17 1504  Weight: 140 lb 8 oz (63.7 kg)    GENERAL:alert, no distress and comfortable SKIN: skin color, texture, turgor are normal, no rashes or significant lesions EYES: normal, Conjunctiva are pink and non-injected, sclera clear OROPHARYNX:no exudate, no erythema and lips, buccal mucosa, and tongue normal  NECK: supple, thyroid normal size, non-tender, without nodularity LYMPH:  no palpable lymphadenopathy in the cervical, axillary or inguinal LUNGS: clear to auscultation and percussion with normal breathing effort HEART: regular rate & rhythm and no murmurs and no lower extremity edema ABDOMEN:abdomen soft, non-tender and normal bowel sounds MUSCULOSKELETAL:no cyanosis of digits and no clubbing  NEURO: alert & oriented x 3 with fluent speech, no focal motor/sensory deficits EXTREMITIES: No lower extremity edema LABORATORY DATA:  I have reviewed the data as listed CMP Latest Ref Rng &  Units 03/01/2017 02/22/2017  Glucose 65 - 99 mg/dL 91 137  BUN 6 - 20 mg/dL 9 11.1  Creatinine 0.44 - 1.00 mg/dL 0.50 0.9  Sodium 135 - 145 mmol/L 132(L) 134(L)  Potassium 3.5 - 5.1 mmol/L 4.2 4.1  Chloride 101 - 111 mmol/L  95(L) -  CO2 22 - 32 mmol/L 25 29  Calcium 8.9 - 10.3 mg/dL 9.8 10.0  Total Protein 6.4 - 8.3 g/dL - 7.5  Total Bilirubin 0.20 - 1.20 mg/dL - 0.96  Alkaline Phos 40 - 150 U/L - 63  AST 5 - 34 U/L - 21  ALT 0 - 55 U/L - 21    Lab Results  Component Value Date   WBC 6.8 03/01/2017   HGB 13.8 03/01/2017   HCT 41.5 03/01/2017   MCV 95.6 03/01/2017   PLT 326 03/01/2017   NEUTROABS 3.8 02/22/2017    ASSESSMENT & PLAN:  Malignant neoplasm of lower-outer quadrant of left breast of female, estrogen receptor positive ([place]) 03/03/18: Left Lumpectomy:0.8 cm IDC grade 1, 0/3 LN neg,  ER 100%, PR 100%, Ki-67 15%, HER-2 negative ratio 1.58 T1bN0 Stage 1A Adjuvant radiation 04/05/2017-05/02/2017  Treatment plan: Adjuvant antiestrogen therapy with letrozole 2.5 mg daily Letrozole counseling: We discussed the risks and benefits of anti-estrogen therapy with aromatase inhibitors. These include but not limited to insomnia, hot flashes, mood changes, vaginal dryness, bone density loss, and weight gain. We strongly believe that the benefits far outweigh the risks. Patient understands these risks and consented to starting treatment. Planned treatment duration is 5 years.  Patient has not made up her mind regarding tamoxifen or letrozole or nothing else. She will think about it and will inform us in March regarding her decision. Once she calls [place] and we can send a prescription for the medication. We will need to see her 3 months after she initiates therapy.   I suspect that the patient will not be taking antiestrogen therapy based upon her friends who have been advising her. If that was the case I will see her back in 1 year for follow-up.   I spent 25 minutes talking to the patient of which more than half was spent in counseling and coordination of care.  No orders of the defined types were placed in this encounter.  The patient has a good understanding of the overall plan. she agrees with it. she  will call with any problems that may develop before the next visit here.   [name], MD 05/02/17

## 2017-05-19 ENCOUNTER — Telehealth: Payer: Self-pay

## 2017-05-19 NOTE — Telephone Encounter (Signed)
Pt called to let Dr.Gudena know that she is 2 weeks post radiation. She is to start anti estrogen therapy 1 month post radiation.She has made up her mind regarding initiating anti estrogen therapy with letrozole.   Pt wanted to discuss side effects and quality of life with taking these medications. Pt expressed her concerns regarding this. She had several discussions with her friends and colleagues about this and have done her research well. She is will be ready to start beginning of April.  Told pt that we can send her out a 30 day supply and to call and update [place] within a few weeks of starting, to let us know how she is tolerating the new medication. Spent over 25 minutes over the phone counseling and reinforcing benefits of anti estrogen in preventing her cancer.   Offered for pt to see our SW or the chaplain for additional emotional resource and support. Pt has gone through some challenges with recent loss of her spouse and stresses in her personal life. Told pt that she may benefit from speaking to someone about this and pt was agreeable to this. Will forward and reach out to [place] (SW) and Lattie Haw [place]).  Pt also concern about bone loss risk with anti estrogen therapy. Told pt that we monitor this by checking bone density check and will usually supplement if there are any abnormality. Pt asking if she can start taking vitamin d and calcium now. Told pt that this is appropriate to do, as well as continuing her physical exercises to help with bone loss. Pt verbalized understanding and very appreciative of time. Obtained order for bone density test from [facility] and will fax to [facility] for pt to get done.   No further needs at this time.

## 2017-05-23 ENCOUNTER — Encounter: Payer: Self-pay | Admitting: General Practice

## 2017-05-23 NOTE — Progress Notes (Signed)
[facility] Spiritual Care Note  Attempted to LVM per referral from May Armel/RN, but mailbox full. Plan to f/u tomorrow by phone or [place] Mail.   [place], [place], [facility] Pager [phone] Voicemail [phone]

## 2017-05-24 ENCOUNTER — Encounter: Payer: Self-pay | Admitting: General Practice

## 2017-05-24 NOTE — Progress Notes (Signed)
[facility] Spiritual Care Note  Mailed Ms Ayo a handwritten note of introduction and encouragement, along with my card and brochure, because vm is still full.   [place], [place], [facility] Pager [phone] Voicemail [phone]

## 2017-05-31 NOTE — Progress Notes (Signed)
  Radiation Oncology         [phone]) [phone] ________________________________  Name: Nichole Ortega MRN: [id number]  Date: 05/02/2017  DOB: November 23, 1950  End of Treatment Note  Diagnosis:   67 y.o. female with Stage IA, pT1bN0M0, Grade I, ER/PR (+), HER-2 (-) invasive ductal carcinoma of the left breast.   Indication for treatment:  Curative       Radiation treatment dates:   04/05/2017 - 05/02/2017  Site/dose:   The patient initially received a dose of 42.5 Gy in 17 fractions to the left breast using whole-breast tangent fields. This was delivered using a 3-D conformal technique. The patient then received a boost to the seroma. This delivered an additional 7.5 Gy in 3 fractions using a 3 field photon technique due to the depth of the seroma. The total dose was 50 Gy.  Narrative: The patient tolerated radiation treatment relatively well.   The patient had some expected skin irritation as she progressed during treatment. Moist desquamation was not present at the end of treatment.  Plan: The patient has completed radiation treatment. The patient will return to radiation oncology clinic for routine followup in one month. I advised the patient to call or return sooner if they have any questions or concerns related to their recovery or treatment. ________________________________  Jodelle Gross, MD, PhD  This document serves as a record of services personally performed by Kyung Rudd, MD. It was created on his behalf by Rae Lips, a trained medical scribe. The creation of this record is based on the scribe's personal observations and the provider's statements to them. This document has been checked and approved by the attending provider.

## 2017-06-13 ENCOUNTER — Other Ambulatory Visit: Payer: Self-pay

## 2017-06-13 MED ORDER — LETROZOLE 2.5 MG PO TABS: 2.5000 mg | ORAL_TABLET | Freq: Every day | ORAL | 0 refills | Status: DC

## 2017-06-13 NOTE — Progress Notes (Signed)
Pt called and have made up her mind to start letrozole. Will send 30 day supply and told pt to call with updates. Pt appreciative of time.  No further concerns.

## 2017-06-23 ENCOUNTER — Telehealth: Payer: Self-pay | Admitting: General Practice

## 2017-06-23 NOTE — Telephone Encounter (Signed)
[facility] CSW Progress Notes  Referral received from Dr Geralyn Flash desk nurse to reach out to patient for support/encouragement/resources.  Recently completed radiation treatment, lives alone, husband died approx 2 years ago.  Spoke w patient, discussed survivorship issues, mailed packet of information and resources.  Encouraged patient to recontact undersigned as desired.  Edwyna Shell, LCSW Clinical Social Worker Phone:  [phone]

## 2017-06-28 ENCOUNTER — Telehealth: Payer: Self-pay

## 2017-06-28 NOTE — Telephone Encounter (Signed)
Received call from pt to report that she is not starting letrozole at this time due to lingering fatigue,malaise and bilateral shoulder aches and pains. Pt had completed radiation therapy 2 months ago, and still feels very tired. She states that she is normally a very active person and just don't have the energy she used to have prior to cancer. Encouraged pt to remain active with exercise and to get plenty of rest/ hydration during her recovery. Told pt that long term side effects can be expected with radiation and surgery. Reinforced appointment for survivorship clinic visit and that she will receive more information about what her next step would be to manage symptoms and what she can do to improve them. Pt verbalized understanding. No further questions at this time.

## 2017-07-10 ENCOUNTER — Other Ambulatory Visit: Payer: Self-pay | Admitting: Hematology and Oncology

## 2017-07-19 ENCOUNTER — Telehealth: Payer: Self-pay

## 2017-07-19 NOTE — Telephone Encounter (Signed)
Spoke with patient to remind of SCP visit on 08/01/17 at 10:30 am with NP.  Patient said she will come to appt.

## 2017-07-31 NOTE — Progress Notes (Signed)
CLINIC:  Survivorship   REASON FOR VISIT:  Routine follow-up post-treatment for a recent history of breast cancer.  BRIEF ONCOLOGIC HISTORY:    Malignant neoplasm of lower-outer quadrant of left breast of female, estrogen receptor positive ([place]) (Res[place])   02/16/2017 Initial Diagnosis    Screening detected left breast mass 8 mm at 4 o'clock position axilla negative biopsy grade 2 invasive ductal carcinoma ER 100%, PR 100%, Ki-67 15%, HER-2 negative ratio 1.58, T1 BN 0 stage I a clinical stage      03/03/2017 Surgery    Left Lumpectomy:0.8 cm IDC grade 1, 0/3 LN neg,  ER 100%, PR 100%, Ki-67 15%, HER-2 negative ratio 1.58 T1bN0 Stage 1A      04/05/2017 - 05/02/2017 Radiation Therapy    Adjuvant radiation therapy      05/2017 -  Anti-estrogen oral therapy    Letrozole daily       INTERVAL HISTORY:  Nichole Ortega presents to the [facility] today for our initial meeting to review her survivorship care plan detailing her treatment course for breast cancer, as well as monitoring long-term side effects of that treatment, education regarding health maintenance, screening, and overall wellness and health promotion.     Nichole Ortega is doing well today.  She has not started taking letrozole daily.  She has the prescription.  She notes that about one month after radiation, she developed fatigue.  She was displeased with feeling so poorly.  She notes that the side effects of Letrozole sound daunting, and she wants to discuss this further.      REVIEW OF SYSTEMS:  Review of Systems  Constitutional: Negative for appetite change, fatigue, fever and unexpected weight change.  HENT:   Negative for hearing loss, lump/mass and trouble swallowing.   Eyes: Negative for eye problems and icterus.  Respiratory: Negative for chest tightness, cough and shortness of breath.   Cardiovascular: Negative for chest pain, leg swelling and palpitations.  Gastrointestinal: Negative for abdominal distention,  abdominal pain, constipation, diarrhea, nausea and vomiting.  Endocrine: Negative for hot flashes.  Musculoskeletal: Negative for arthralgias.       Notes right shoulder/shoulder blade pain since radiation  Skin: Negative for itching and rash.  Neurological: Negative for dizziness, extremity weakness, headaches and numbness.  Hematological: Negative for adenopathy. Does not bruise/bleed easily.  Psychiatric/Behavioral: Negative for depression. The patient is not nervous/anxious.   Breast: Denies any new nodularity, masses, tenderness, nipple changes, or nipple discharge.      ONCOLOGY TREATMENT TEAM:  1. Surgeon:  Dr. Excell Seltzer at [facility] 2. Medical Oncologist: Dr. Lindi Adie  3. Radiation Oncologist: Dr. Lisbeth Renshaw    PAST MEDICAL/SURGICAL HISTORY:  Past Medical History:  Diagnosis Date  . Cancer ([place])    breast Ca- L    . Heart murmur   . Hypertension   . Pneumonia    hosp.- as a child   Past Surgical History:  Procedure Laterality Date  . BREAST LUMPECTOMY WITH RADIOACTIVE SEED AND SENTINEL LYMPH NODE BIOPSY Left 03/03/2017   Procedure: RADIOCATIVE SEED GUIDED LEFT BREAST LUMPECTOMY WITH LEFT AXILLARY SENTINEL LYMPH NODE BIOPSY;  Surgeon: Excell Seltzer, MD;  Location: [facility];  Service: General;  Laterality: Left;  . OOPHORECTOMY Right      ALLERGIES:  Allergies  Allergen Reactions  . Codeine Itching     CURRENT MEDICATIONS:  Outpatient Encounter Medications as of 08/01/2017  Medication Sig  . acetaminophen (TYLENOL) 500 MG tablet Take 1,000 mg by mouth every 6 (six) hours  as needed.  Marland Kitchen amLODipine (NORVASC) 10 MG tablet Take 10 mg by mouth daily.  Marland Kitchen letrozole ([place]) 2.5 MG tablet TAKE ONE TABLET BY MOUTH DAILY (Patient not taking: Reported on 08/01/2017)   No facility-administered encounter medications on file as of 08/01/2017.      ONCOLOGIC FAMILY HISTORY:  Family History  Problem Relation Age of Onset  . Cancer Mother        melanoma  .  Alzheimer's disease Mother   . Cancer Father        prostate  . Breast cancer Paternal Grandmother 47  . Breast cancer Cousin 56  . Cancer Paternal Aunt        lung  . Cancer Paternal Uncle        lung  . Cancer Paternal Grandfather        lung     GENETIC COUNSELING/TESTING: Not at this time  SOCIAL HISTORY:  Social History   Socioeconomic History  . Marital status: Widowed    Spouse name: Not on file  . Number of children: 0  . Years of education: Not on file  . Highest education level: Not on file  Occupational History  . 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 Financial resource strain: Not on file  . Food insecurity:    Worry: Not on file    Inability: Not on file  . Transportation needs:    Medical: Not on file    Non-medical: Not on file  Tobacco Use  . Smoking status: Never Smoker  . Smokeless tobacco: Never Used  Substance and Sexual Activity  . Alcohol use: Yes    Alcohol/week: 4.2 oz    Types: 7 Glasses of wine per week    Comment: 1 glass of red wine /day   . Drug use: No  . Sexual activity: Not Currently  Lifestyle  . Physical activity:    Days per week: Not on file    Minutes per session: Not on file  . Stress: Not on file  Relationships  . Social connections:    Talks on phone: Not on file    Gets together: Not on file    Attends religious service: Not on file    Active member of club or organization: Not on file    Attends meetings of clubs or organizations: Not on file    Relationship status: Not on file  . Intimate partner violence:    Fear of current or ex partner: Not on file    Emotionally abused: Not on file    Physically abused: Not on file    Forced sexual activity: Not on file  Other Topics Concern  . Not on file  Social History Narrative   Widowed. No children.   Primary caregiver for mother who suffered from [place]. Mother has since passed away.   Currently primary caregiver  for 67 year old father.      PHYSICAL EXAMINATION:  Vital Signs:   Vitals:   08/01/17 1004  BP: 128/64  Pulse: 72  Resp: 18  Temp: 98.4 F (36.9 C)  SpO2: 98%   Filed Weights   08/01/17 1004  Weight: 140 lb 3.2 oz (63.6 kg)   General: Well-nourished, well-appearing female in no acute distress.  She is unaccompanied today.   HEENT: Head is normocephalic.  Pupils equal and reactive to light. Conjunctivae clear without exudate.  Sclerae anicteric. Oral mucosa is pink, moist.  Oropharynx  is pink without lesions or erythema.  Lymph: No cervical, supraclavicular, or infraclavicular lymphadenopathy noted on palpation.  Cardiovascular: Regular rate and rhythm.Marland Kitchen Respiratory: Clear to auscultation bilaterally. Chest expansion symmetric; breathing non-labored.  Breasts: left breast s/p lumpectomy and radiation, no nodularity, no masses, no sign of recurrence, right breast without nodules, masses, skin or nipple changes GI: Abdomen soft and round; non-tender, non-distended. Bowel sounds normoactive.  GU: Deferred.  Neuro: No focal deficits. Steady gait.  Psych: Mood and affect normal and appropriate for situation.  Extremities: No edema. MSK: No focal spinal tenderness to palpation.  Full range of motion in bilateral upper extremities. No TTP on right scapula/shoulder, rhomboid muscle Skin: Warm and dry.  LABORATORY DATA:  None for this visit.  DIAGNOSTIC IMAGING:  None for this visit.      ASSESSMENT AND PLAN:  Ms.. Portlock is a pleasant 67 y.o. female with Stage IA left breast invasive ductal carcinoma, ER+/PR+/HER2-, diagnosed in 02/2017, treated with lumpectomy, adjuvant radiation therapy, and anti-estrogen therapy with Letrozole beginning in 05/2017.  She presents to the Survivorship Clinic for our initial meeting and routine follow-up post-completion of treatment for breast cancer.    1. Stage IA left breast cancer:  Ms. Rotenberg is continuing to recover from definitive  treatment for breast cancer. She will follow-up with her medical oncologist, Dr. Cleda Daub 6 months with history and physical exam per surveillance protocol. She has not yet started her anti estrogen therapy.  I reviewed potential side effects and she would still like to think about it.  I reviewed with her that anti estrogen therapy would reduce her risk of recurrence by about 1/3.  Today, a comprehensive survivorship care plan and treatment summary was reviewed with the patient today detailing her breast cancer diagnosis, treatment course, potential late/long-term effects of treatment, appropriate follow-up care with recommendations for the future, and patient education resources.  A copy of this summary, along with a letter will be sent to the patient's primary care provider via mail/fax/In Basket message after today's visit.    2. Bone health:  Given Ms. Lingle's age/history of breast cancer and her potential treatment regimen including anti-estrogen therapy with Letrozole, she is at risk for bone demineralization.  She thinks she underwent bone density with her PCP.  She has an appointment with them in 10/2017 and will see.  If she has undergone one there, she will get a copy and bring to [place], in the event she chooses to start Letrozole, so that we can monitor her bone density every two years.  She was given education on specific activities to promote bone health.  3. Cancer screening:  Due to Ms. Wilcoxen's history and her age, she should receive screening for skin cancers, colon cancer, and gynecologic cancers.  The information and recommendations are listed on the patient's comprehensive care plan/treatment summary and were reviewed in detail with the patient.    4. Health maintenance and wellness promotion: Ms. Dimartino was encouraged to consume 5-7 servings of fruits and vegetables per day. We reviewed the "Nutrition Rainbow" handout, as well as the handout "Take Control of Your Health and Reduce Your  Cancer Risk" from the [place].  She was also encouraged to engage in moderate to vigorous exercise for 30 minutes per day most days of the week. We discussed the [employer] fitness program, which is designed for cancer survivors to help them become more physically fit after cancer treatments.  She was instructed to limit her alcohol consumption and  continue to abstain from tobacco use.     5. Support services/counseling: It is not uncommon for this period of the patient's cancer care trajectory to be one of many emotions and stressors.  We discussed an opportunity for her to participate in the next session of [employer] ("Finding Your New Normal") support group series designed for patients after they have completed treatment.   Ms. Zumwalt was encouraged to take advantage of our many other support services programs, support groups, and/or counseling in coping with her new life as a cancer survivor after completing anti-cancer treatment.  She was given information regarding our available services and encouraged to contact me with any questions or for help enrolling in any of our support group/programs.   6. Shoulder pain: Ordered xray to fully evaluate.   Dispo:   -Return to cancer center in 6 months for follow up with Dr. Lindi Adie  -Mammogram due in 02/2018 -Follow up with Dr. Excell Seltzer -She is welcome to return back to the Survivorship Clinic at any time; no additional follow-up needed at this time.  -Consider referral back to survivorship as a long-term survivor for continued surveillance  A total of (30) minutes of face-to-face time was spent with this patient with greater than 50% of that time in counseling and care-coordination.   Gardenia Phlegm, [place] [phone]   Note: PRIMARY CARE PROVIDER Jani Gravel, [place] [phone]

## 2017-08-01 ENCOUNTER — Other Ambulatory Visit: Payer: Self-pay | Admitting: Adult Health

## 2017-08-01 ENCOUNTER — Inpatient Hospital Stay: Payer: Medicare Other | Attending: Adult Health | Admitting: Adult Health

## 2017-08-01 ENCOUNTER — Encounter: Payer: Self-pay | Admitting: Adult Health

## 2017-08-01 ENCOUNTER — Telehealth: Payer: Self-pay | Admitting: Adult Health

## 2017-08-01 ENCOUNTER — Ambulatory Visit (HOSPITAL_COMMUNITY)
Admission: RE | Admit: 2017-08-01 | Discharge: 2017-08-01 | Disposition: A | Discharge status: Home / Self Care | Payer: Medicare Other | Source: Ambulatory Visit | Attending: Adult Health | Admitting: Adult Health

## 2017-08-01 VITALS — BP 128/64 | HR 72 | Temp 98.4°F | Resp 18 | Ht 67.0 in | Wt 140.2 lb

## 2017-08-01 DIAGNOSIS — C50512 Malignant neoplasm of lower-outer quadrant of left female breast: Secondary | ICD-10-CM

## 2017-08-01 DIAGNOSIS — M899 Disorder of bone, unspecified: Secondary | ICD-10-CM | POA: Diagnosis not present

## 2017-08-01 DIAGNOSIS — Z170 Estrogen receptor positive status [ER+]: Secondary | ICD-10-CM | POA: Insufficient documentation

## 2017-08-01 DIAGNOSIS — C50212 Malignant neoplasm of upper-inner quadrant of left female breast: Secondary | ICD-10-CM | POA: Insufficient documentation

## 2017-08-01 DIAGNOSIS — I10 Essential (primary) hypertension: Secondary | ICD-10-CM | POA: Diagnosis not present

## 2017-08-01 DIAGNOSIS — Z79811 Long term (current) use of aromatase inhibitors: Secondary | ICD-10-CM | POA: Diagnosis not present

## 2017-08-01 DIAGNOSIS — M25511 Pain in right shoulder: Secondary | ICD-10-CM | POA: Diagnosis not present

## 2017-08-01 NOTE — Telephone Encounter (Signed)
Reviewed scapula xrays with patient.  Informed her that due to the densities in her scapula and her right lower neck that we need to do further imaging to evaluate.  I let her know that I reviewed these xrays with Dr. Lindi Adie and that he recommended CT neck and chest.  Order placed.    Wilber Bihari, NP

## 2017-08-02 ENCOUNTER — Other Ambulatory Visit: Payer: Self-pay

## 2017-08-02 DIAGNOSIS — C50512 Malignant neoplasm of lower-outer quadrant of left female breast: Secondary | ICD-10-CM

## 2017-08-02 DIAGNOSIS — Z170 Estrogen receptor positive status [ER+]: Principal | ICD-10-CM

## 2017-08-04 ENCOUNTER — Ambulatory Visit (HOSPITAL_COMMUNITY)
Admission: RE | Admit: 2017-08-04 | Discharge: 2017-08-04 | Disposition: A | Discharge status: Home / Self Care | Payer: Medicare Other | Source: Ambulatory Visit | Attending: Adult Health | Admitting: Adult Health

## 2017-08-04 ENCOUNTER — Inpatient Hospital Stay: Payer: Medicare Other

## 2017-08-04 DIAGNOSIS — I700 Atherosclerosis of aorta: Secondary | ICD-10-CM | POA: Insufficient documentation

## 2017-08-04 DIAGNOSIS — Z79811 Long term (current) use of aromatase inhibitors: Secondary | ICD-10-CM | POA: Diagnosis not present

## 2017-08-04 DIAGNOSIS — Z170 Estrogen receptor positive status [ER+]: Principal | ICD-10-CM

## 2017-08-04 DIAGNOSIS — Z9889 Other specified postprocedural states: Secondary | ICD-10-CM | POA: Insufficient documentation

## 2017-08-04 DIAGNOSIS — C50512 Malignant neoplasm of lower-outer quadrant of left female breast: Secondary | ICD-10-CM

## 2017-08-04 DIAGNOSIS — Z853 Personal history of malignant neoplasm of breast: Secondary | ICD-10-CM | POA: Diagnosis not present

## 2017-08-04 DIAGNOSIS — J8410 Pulmonary fibrosis, unspecified: Secondary | ICD-10-CM | POA: Diagnosis not present

## 2017-08-04 DIAGNOSIS — I10 Essential (primary) hypertension: Secondary | ICD-10-CM | POA: Diagnosis not present

## 2017-08-04 DIAGNOSIS — E041 Nontoxic single thyroid nodule: Secondary | ICD-10-CM | POA: Diagnosis not present

## 2017-08-04 DIAGNOSIS — C50212 Malignant neoplasm of upper-inner quadrant of left female breast: Secondary | ICD-10-CM | POA: Diagnosis not present

## 2017-08-04 LAB — CMP (CANCER CENTER ONLY)
ALK PHOS: 56 U/L (ref 40–150)
ALT: 23 U/L (ref 0–55)
AST: 25 U/L (ref 5–34)
Albumin: 4.5 g/dL (ref 3.5–5.0)
Anion gap: 9 (ref 3–11)
BILIRUBIN TOTAL: 0.9 mg/dL (ref 0.2–1.2)
BUN: 13 mg/dL (ref 7–26)
CALCIUM: 9.6 mg/dL (ref 8.4–10.4)
CO2: 28 mmol/L (ref 22–29)
CREATININE: 0.64 mg/dL (ref 0.60–1.10)
Chloride: 95 mmol/L — ABNORMAL LOW (ref 98–109)
GFR, Est AFR Am: >60 mL/min (ref >60)
GLUCOSE: 89 mg/dL (ref 70–140)
POTASSIUM: 4.2 mmol/L (ref 3.5–5.1)
Sodium: 132 mmol/L — ABNORMAL LOW (ref 136–145)
TOTAL PROTEIN: 7 g/dL (ref 6.4–8.3)

## 2017-08-04 MED ORDER — IOPAMIDOL (ISOVUE-300) INJECTION 61%
100.0000 mL | Freq: Once | INTRAVENOUS | Status: AC | PRN
  Administered 2017-08-04: 100 mL via INTRAVENOUS

## 2017-08-04 MED ORDER — IOPAMIDOL (ISOVUE-300) INJECTION 61%
INTRAVENOUS | Status: AC
  Filled 2017-08-04: qty 100

## 2017-08-07 ENCOUNTER — Telehealth: Payer: Self-pay

## 2017-08-07 NOTE — Telephone Encounter (Signed)
Spoke with patient regarding CT results.  Scans reviewed by MD and no further acton to be taken.  Explained to patient and understanding was verbalized.  No questions at this time.

## 2017-09-06 ENCOUNTER — Telehealth: Payer: Self-pay

## 2017-09-06 NOTE — Telephone Encounter (Signed)
Pt called to request for her CT and Xray results to be mailed to her home. She is having a hard time signing in to mychart at this time and would like to have a copy of her results. Pt currently off of antiestrogen and is doing much better. No further needs at this time. Confirmed and verified address.

## 2017-10-24 DIAGNOSIS — H524 Presbyopia: Secondary | ICD-10-CM | POA: Diagnosis not present

## 2017-10-24 DIAGNOSIS — H5203 Hypermetropia, bilateral: Secondary | ICD-10-CM | POA: Diagnosis not present

## 2017-10-24 DIAGNOSIS — H2513 Age-related nuclear cataract, bilateral: Secondary | ICD-10-CM | POA: Diagnosis not present

## 2017-10-24 DIAGNOSIS — H25013 Cortical age-related cataract, bilateral: Secondary | ICD-10-CM | POA: Diagnosis not present

## 2017-10-26 DIAGNOSIS — I10 Essential (primary) hypertension: Secondary | ICD-10-CM | POA: Diagnosis not present

## 2017-11-08 DIAGNOSIS — I10 Essential (primary) hypertension: Secondary | ICD-10-CM | POA: Diagnosis not present

## 2017-11-08 DIAGNOSIS — C50912 Malignant neoplasm of unspecified site of left female breast: Secondary | ICD-10-CM | POA: Diagnosis not present

## 2017-11-08 DIAGNOSIS — Z01419 Encounter for gynecological examination (general) (routine) without abnormal findings: Secondary | ICD-10-CM | POA: Diagnosis not present

## 2017-11-08 DIAGNOSIS — Z1212 Encounter for screening for malignant neoplasm of rectum: Secondary | ICD-10-CM | POA: Diagnosis not present

## 2017-11-08 DIAGNOSIS — Z23 Encounter for immunization: Secondary | ICD-10-CM | POA: Diagnosis not present

## 2017-11-08 DIAGNOSIS — Z0000 Encounter for general adult medical examination without abnormal findings: Secondary | ICD-10-CM | POA: Diagnosis not present

## 2017-11-20 DIAGNOSIS — Z23 Encounter for immunization: Secondary | ICD-10-CM | POA: Diagnosis not present

## 2017-11-24 DIAGNOSIS — Z170 Estrogen receptor positive status [ER+]: Secondary | ICD-10-CM | POA: Diagnosis not present

## 2017-11-24 DIAGNOSIS — C50912 Malignant neoplasm of unspecified site of left female breast: Secondary | ICD-10-CM | POA: Diagnosis not present

## 2017-12-22 ENCOUNTER — Telehealth: Payer: Self-pay | Admitting: Hematology and Oncology

## 2017-12-22 NOTE — Telephone Encounter (Signed)
VG PAL 11/27 - moved f/u to 11/26. Spoke with patient and patient not able to come 11/26. Per patient request appointment moved to 11/25. Patient has new date/time.

## 2018-01-29 ENCOUNTER — Telehealth: Payer: Self-pay | Admitting: Hematology and Oncology

## 2018-01-29 ENCOUNTER — Inpatient Hospital Stay: Payer: Medicare Other | Attending: Hematology and Oncology | Admitting: Hematology and Oncology

## 2018-01-29 DIAGNOSIS — Z923 Personal history of irradiation: Secondary | ICD-10-CM | POA: Insufficient documentation

## 2018-01-29 DIAGNOSIS — C50512 Malignant neoplasm of lower-outer quadrant of left female breast: Secondary | ICD-10-CM

## 2018-01-29 DIAGNOSIS — Z853 Personal history of malignant neoplasm of breast: Secondary | ICD-10-CM | POA: Insufficient documentation

## 2018-01-29 DIAGNOSIS — Z170 Estrogen receptor positive status [ER+]: Secondary | ICD-10-CM

## 2018-01-29 NOTE — Progress Notes (Signed)
Patient Care Team: Jani Gravel, MD as PCP - General (Internal Medicine) Nicholas Lose, MD as Consulting Physician (Hematology and Oncology) Kyung Rudd, MD as Consulting Physician (Radiation Oncology) Excell Seltzer, MD as Consulting Physician (General Surgery) Delice Bison Charlestine Massed, NP as Nurse Practitioner (Hematology and Oncology)  DIAGNOSIS:  Encounter Diagnosis  Name Primary?  . Malignant neoplasm of lower-outer quadrant of left breast of female, estrogen receptor positive ([place])     SUMMARY OF ONCOLOGIC HISTORY:   Malignant neoplasm of lower-outer quadrant of left breast of female, estrogen receptor positive ([place])   02/16/2017 Initial Diagnosis    Screening detected left breast mass 8 mm at 4 o'clock position axilla negative biopsy grade 2 invasive ductal carcinoma ER 100%, PR 100%, Ki-67 15%, HER-2 negative ratio 1.58, T1 BN 0 stage I a clinical stage    03/03/2017 Surgery    Left Lumpectomy:0.8 cm IDC grade 1, 0/3 LN neg,  ER 100%, PR 100%, Ki-67 15%, HER-2 negative ratio 1.58 T1bN0 Stage 1A    04/05/2017 - 05/02/2017 Radiation Therapy    Adjuvant radiation therapy     Anti-estrogen oral therapy    Recommended antiestrogen therapy but patient refused     CHIEF COMPLIANT: Follow-up of breast cancer  INTERVAL HISTORY: Nichole Ortega is a 64-year with above-mentioned history of left breast cancer treated with lumpectomy and radiation.  She took a long time to recover from the effects of radiation and finally she was able to recover.  We prescribed her antiestrogen therapy but she did not want to take it because of anxiety and worry about side effects.  REVIEW OF SYSTEMS:   Constitutional: Denies fevers, chills or abnormal weight loss Eyes: Denies blurriness of vision Ears, nose, mouth, throat, and face: Denies mucositis or sore throat Respiratory: Denies cough, dyspnea or wheezes Cardiovascular: Denies palpitation, chest discomfort Gastrointestinal:  Denies nausea,  heartburn or change in bowel habits Skin: Denies abnormal skin rashes Lymphatics: Denies new lymphadenopathy or easy bruising Neurological:Denies numbness, tingling or new weaknesses Behavioral/Psych: Mood is stable, no new changes  Extremities: No lower extremity edema Breast:  denies any pain or lumps or nodules in either breasts All other systems were reviewed with the patient and are negative.  I have reviewed the past medical history, past surgical history, social history and family history with the patient and they are unchanged from previous note.  ALLERGIES:  is allergic to codeine.  MEDICATIONS:  Current Outpatient Medications  Medication Sig Dispense Refill  . acetaminophen (TYLENOL) 500 MG tablet Take 1,000 mg by mouth every 6 (six) hours as needed.    Marland Kitchen amLODipine (NORVASC) 10 MG tablet Take 10 mg by mouth daily.     No current facility-administered medications for this visit.     PHYSICAL EXAMINATION: ECOG PERFORMANCE STATUS: 0 - Asymptomatic  Vitals:   01/29/18 1102  BP: 131/77  Pulse: 80  Resp: 17  Temp: (!) 97.2 F (36.2 C)  SpO2: 98%   Filed Weights   01/29/18 1102  Weight: 142 lb 8 oz (64.6 kg)    GENERAL:alert, no distress and comfortable SKIN: skin color, texture, turgor are normal, no rashes or significant lesions EYES: normal, Conjunctiva are pink and non-injected, sclera clear OROPHARYNX:no exudate, no erythema and lips, buccal mucosa, and tongue normal  NECK: supple, thyroid normal size, non-tender, without nodularity LYMPH:  no palpable lymphadenopathy in the cervical, axillary or inguinal LUNGS: clear to auscultation and percussion with normal breathing effort HEART: regular rate & rhythm and no murmurs and  no lower extremity edema ABDOMEN:abdomen soft, non-tender and normal bowel sounds MUSCULOSKELETAL:no cyanosis of digits and no clubbing  NEURO: alert & oriented x 3 with fluent speech, no focal motor/sensory deficits EXTREMITIES: No lower  extremity edema   LABORATORY DATA:  I have reviewed the data as listed CMP Latest Ref Rng & Units 08/04/2017 03/01/2017 02/22/2017  Glucose 70 - 140 mg/dL 89 91 137  BUN 7 - 26 mg/dL 13 9 11.1  Creatinine 0.60 - 1.10 mg/dL 0.64 0.50 0.9  Sodium 136 - 145 mmol/L 132(L) 132(L) 134(L)  Potassium 3.5 - 5.1 mmol/L 4.2 4.2 4.1  Chloride 98 - 109 mmol/L 95(L) 95(L) -  CO2 22 - 29 mmol/L 28 25 29  Calcium 8.4 - 10.4 mg/dL 9.6 9.8 10.0  Total Protein 6.4 - 8.3 g/dL 7.0 - 7.5  Total Bilirubin 0.2 - 1.2 mg/dL 0.9 - 0.96  Alkaline Phos 40 - 150 U/L 56 - 63  AST 5 - 34 U/L 25 - 21  ALT 0 - 55 U/L 23 - 21    Lab Results  Component Value Date   WBC 6.8 03/01/2017   HGB 13.8 03/01/2017   HCT 41.5 03/01/2017   MCV 95.6 03/01/2017   PLT 326 03/01/2017   NEUTROABS 3.8 02/22/2017    ASSESSMENT & PLAN:  Malignant neoplasm of lower-outer quadrant of left breast of female, estrogen receptor positive ([facility]) 03/03/18:Left Lumpectomy:0.8 cm IDC grade 1, 0/3 LN neg, ER 100%, PR 100%, Ki-67 15%, HER-2 negative ratio 1.58 T1bN0 Stage 1A Adjuvant radiation 04/05/2017-05/02/2017  Treatment plan: We recommended letrozole but patient refused to take it.  Surveillance: Mammograms to be ordered for December 2019.  She does this at [facility]. Return to clinic in 1 year for long-term survivorship with Lindsey.     Orders Placed This Encounter  Procedures  . MM DIAG BREAST TOMO BILATERAL    Standing Status:   Future    Standing Expiration Date:   01/30/2019    Order Specific Question:   Reason for Exam (SYMPTOM  OR DIAGNOSIS REQUIRED)    Answer:   Annual mammograms with H/O breast cancer    Order Specific Question:   Preferred imaging location?    Answer:   External    Comments:   [facility]   The patient has a good understanding of the overall plan. she agrees with it. she will call with any problems that may develop before the next visit here.   Viinay K Gudena, MD 01/29/18    

## 2018-01-29 NOTE — Assessment & Plan Note (Signed)
03/03/18:Left Lumpectomy:0.8 cm IDC grade 1, 0/3 LN neg, ER 100%, PR 100%, Ki-67 15%, HER-2 negative ratio 1.58 T1bN0 Stage 1A Adjuvant radiation 04/05/2017-05/02/2017  Treatment plan: Adjuvant antiestrogen therapy with letrozole 2.5 mg daily,   Letrozole toxicities: Patient has not started it

## 2018-01-29 NOTE — Telephone Encounter (Signed)
Gave patient avs and calendar.  Faxed order to [facility] for mammogram.

## 2018-01-30 ENCOUNTER — Ambulatory Visit: Payer: Medicare Other | Admitting: Hematology and Oncology

## 2018-02-02 ENCOUNTER — Ambulatory Visit: Payer: Medicare Other | Admitting: Hematology and Oncology

## 2018-03-05 ENCOUNTER — Encounter: Payer: Self-pay | Admitting: Adult Health

## 2018-03-05 DIAGNOSIS — Z853 Personal history of malignant neoplasm of breast: Secondary | ICD-10-CM | POA: Diagnosis not present

## 2018-03-05 DIAGNOSIS — Z170 Estrogen receptor positive status [ER+]: Secondary | ICD-10-CM | POA: Diagnosis not present

## 2018-04-30 ENCOUNTER — Inpatient Hospital Stay: Payer: Medicare Other | Attending: Hematology and Oncology | Admitting: Hematology and Oncology

## 2018-04-30 DIAGNOSIS — Z79899 Other long term (current) drug therapy: Secondary | ICD-10-CM | POA: Diagnosis not present

## 2018-04-30 DIAGNOSIS — Z853 Personal history of malignant neoplasm of breast: Secondary | ICD-10-CM | POA: Diagnosis not present

## 2018-04-30 DIAGNOSIS — Z170 Estrogen receptor positive status [ER+]: Secondary | ICD-10-CM

## 2018-04-30 DIAGNOSIS — Z923 Personal history of irradiation: Secondary | ICD-10-CM | POA: Diagnosis not present

## 2018-04-30 DIAGNOSIS — C50512 Malignant neoplasm of lower-outer quadrant of left female breast: Secondary | ICD-10-CM

## 2018-04-30 NOTE — Progress Notes (Signed)
Patient Care Team: Jani Gravel, MD as PCP - General (Internal Medicine) Nicholas Lose, MD as Consulting Physician (Hematology and Oncology) Kyung Rudd, MD as Consulting Physician (Radiation Oncology) Excell Seltzer, MD as Consulting Physician (General Surgery) Delice Bison Charlestine Massed, NP as Nurse Practitioner (Hematology and Oncology)  DIAGNOSIS:  Encounter Diagnosis  Name Primary?  . Malignant neoplasm of lower-outer quadrant of left breast of female, estrogen receptor positive ([place])     SUMMARY OF ONCOLOGIC HISTORY:   Malignant neoplasm of lower-outer quadrant of left breast of female, estrogen receptor positive ([place])   02/16/2017 Initial Diagnosis    Screening detected left breast mass 8 mm at 4 o'clock position axilla negative biopsy grade 2 invasive ductal carcinoma ER 100%, PR 100%, Ki-67 15%, HER-2 negative ratio 1.58, T1 BN 0 stage I a clinical stage    03/03/2017 Surgery    Left Lumpectomy:0.8 cm IDC grade 1, 0/3 LN neg,  ER 100%, PR 100%, Ki-67 15%, HER-2 negative ratio 1.58 T1bN0 Stage 1A    04/05/2017 - 05/02/2017 Radiation Therapy    Adjuvant radiation therapy     Anti-estrogen oral therapy    Recommended antiestrogen therapy but patient refused     CHIEF COMPLIANT: Surveillance of breast cancer  INTERVAL HISTORY: Nichole Ortega is a 68 year old with above-mentioned history of left breast cancer who completed lumpectomy and radiation and refused antiestrogen therapy.  She is here for annual follow-up and reports no problems or concerns but denies any lumps or nodules in the breast.  She has mild occasional discomfort in the left axillary area from scar tissue.  REVIEW OF SYSTEMS:   Constitutional: Denies fevers, chills or abnormal weight loss Eyes: Denies blurriness of vision Ears, nose, mouth, throat, and face: Denies mucositis or sore throat Respiratory: Denies cough, dyspnea or wheezes Cardiovascular: Denies palpitation, chest discomfort Gastrointestinal:   Denies nausea, heartburn or change in bowel habits Skin: Denies abnormal skin rashes Lymphatics: Denies new lymphadenopathy or easy bruising Neurological:Denies numbness, tingling or new weaknesses Behavioral/Psych: Mood is stable, no new changes  Extremities: No lower extremity edema Breast:  denies any pain or lumps or nodules in either breasts, mild discomfort in the left axilla intermittently All other systems were reviewed with the patient and are negative.  I have reviewed the past medical history, past surgical history, social history and family history with the patient and they are unchanged from previous note.  ALLERGIES:  is allergic to codeine.  MEDICATIONS:  Current Outpatient Medications  Medication Sig Dispense Refill  . acetaminophen (TYLENOL) 500 MG tablet Take 1,000 mg by mouth every 6 (six) hours as needed.    Marland Kitchen amLODipine (NORVASC) 10 MG tablet Take 10 mg by mouth daily.     No current facility-administered medications for this visit.     PHYSICAL EXAMINATION: ECOG PERFORMANCE STATUS: 1 - Symptomatic but completely ambulatory  Vitals:   04/30/18 1117  BP: 116/71  Pulse: 70  Resp: 18  Temp: 98.5 F (36.9 C)  SpO2: 98%   Filed Weights   04/30/18 1117  Weight: 142 lb 3.2 oz (64.5 kg)    GENERAL:alert, no distress and comfortable SKIN: skin color, texture, turgor are normal, no rashes or significant lesions EYES: normal, Conjunctiva are pink and non-injected, sclera clear OROPHARYNX:no exudate, no erythema and lips, buccal mucosa, and tongue normal  NECK: supple, thyroid normal size, non-tender, without nodularity LYMPH:  no palpable lymphadenopathy in the cervical, axillary or inguinal LUNGS: clear to auscultation and percussion with normal breathing effort HEART: regular  rate & rhythm and no murmurs and no lower extremity edema ABDOMEN:abdomen soft, non-tender and normal bowel sounds MUSCULOSKELETAL:no cyanosis of digits and no clubbing  NEURO: alert  & oriented x 3 with fluent speech, no focal motor/sensory deficits EXTREMITIES: No lower extremity edema   LABORATORY DATA:  I have reviewed the data as listed CMP Latest Ref Rng & Units 08/04/2017 03/01/2017 02/22/2017  Glucose 70 - 140 mg/dL 89 91 137  BUN 7 - 26 mg/dL 13 9 11.1  Creatinine 0.60 - 1.10 mg/dL 0.64 0.50 0.9  Sodium 136 - 145 mmol/L 132(L) 132(L) 134(L)  Potassium 3.5 - 5.1 mmol/L 4.2 4.2 4.1  Chloride 98 - 109 mmol/L 95(L) 95(L) -  CO2 22 - 29 mmol/L 28 25 29   Calcium 8.4 - 10.4 mg/dL 9.6 9.8 10.0  Total Protein 6.4 - 8.3 g/dL 7.0 - 7.5  Total Bilirubin 0.2 - 1.2 mg/dL 0.9 - 0.96  Alkaline Phos 40 - 150 U/L 56 - 63  AST 5 - 34 U/L 25 - 21  ALT 0 - 55 U/L 23 - 21    Lab Results  Component Value Date   WBC 6.8 03/01/2017   HGB 13.8 03/01/2017   HCT 41.5 03/01/2017   MCV 95.6 03/01/2017   PLT 326 03/01/2017   NEUTROABS 3.8 02/22/2017    ASSESSMENT & PLAN:  Malignant neoplasm of lower-outer quadrant of left breast of female, estrogen receptor positive (HCC) 03/03/18:Left Lumpectomy:0.8 cm IDC grade 1, 0/3 LN neg, ER 100%, PR 100%, Ki-67 15%, HER-2 negative ratio 1.58 T1bN0 Stage 1A Adjuvant radiation 04/05/2017-05/02/2017  Treatment plan: We recommended letrozole but patient refused to take it.  Surveillance: 1. Breast exam 04/30/2018: Benign 2. Mammograms 03/06/2018. at [place].  Breast density category B  Patient tells me that she has found multiple friends were giving her good company since her husband passed away.  She is also learning to enjoy herself without being constantly in fear of breast cancer.  Return to clinic in 1 year for follow-up  No orders of the defined types were placed in this encounter.  The patient has a good understanding of the overall plan. she agrees with it. she will call with any problems that may develop before the next visit here.   [name], MD 04/30/18

## 2018-04-30 NOTE — Assessment & Plan Note (Signed)
03/03/18:Left Lumpectomy:0.8 cm IDC grade 1, 0/3 LN neg, ER 100%, PR 100%, Ki-67 15%, HER-2 negative ratio 1.58 T1bN0 Stage 1A Adjuvant radiation 04/05/2017-05/02/2017  Treatment plan: We recommended letrozole but patient refused to take it.  Surveillance: 1. Breast exam 04/30/2018: Benign 2. Mammograms 03/06/2018. at [facility].  Breast density category B  Return to clinic in 1 year for follow-up

## 2018-09-20 DIAGNOSIS — Z113 Encounter for screening for infections with a predominantly sexual mode of transmission: Secondary | ICD-10-CM | POA: Diagnosis not present

## 2018-09-20 DIAGNOSIS — Z853 Personal history of malignant neoplasm of breast: Secondary | ICD-10-CM | POA: Diagnosis not present

## 2018-09-20 DIAGNOSIS — R300 Dysuria: Secondary | ICD-10-CM | POA: Diagnosis not present

## 2018-09-20 DIAGNOSIS — L309 Dermatitis, unspecified: Secondary | ICD-10-CM | POA: Diagnosis not present

## 2018-09-26 ENCOUNTER — Encounter: Payer: Self-pay | Admitting: Adult Health

## 2018-11-05 DIAGNOSIS — Z853 Personal history of malignant neoplasm of breast: Secondary | ICD-10-CM | POA: Diagnosis not present

## 2018-11-07 DIAGNOSIS — H2513 Age-related nuclear cataract, bilateral: Secondary | ICD-10-CM | POA: Diagnosis not present

## 2018-11-07 DIAGNOSIS — H524 Presbyopia: Secondary | ICD-10-CM | POA: Diagnosis not present

## 2018-11-07 DIAGNOSIS — H25013 Cortical age-related cataract, bilateral: Secondary | ICD-10-CM | POA: Diagnosis not present

## 2018-11-07 DIAGNOSIS — H04123 Dry eye syndrome of bilateral lacrimal glands: Secondary | ICD-10-CM | POA: Diagnosis not present

## 2018-12-14 DIAGNOSIS — Z23 Encounter for immunization: Secondary | ICD-10-CM | POA: Diagnosis not present

## 2019-01-08 ENCOUNTER — Telehealth: Payer: Self-pay | Admitting: Adult Health

## 2019-01-08 NOTE — Telephone Encounter (Signed)
Talk with patient she request day since we had to move her

## 2019-01-29 ENCOUNTER — Other Ambulatory Visit: Payer: Self-pay

## 2019-01-30 DIAGNOSIS — E559 Vitamin D deficiency, unspecified: Secondary | ICD-10-CM | POA: Diagnosis not present

## 2019-01-30 DIAGNOSIS — I10 Essential (primary) hypertension: Secondary | ICD-10-CM | POA: Diagnosis not present

## 2019-01-30 DIAGNOSIS — E039 Hypothyroidism, unspecified: Secondary | ICD-10-CM | POA: Diagnosis not present

## 2019-02-04 ENCOUNTER — Encounter: Payer: Medicare Other | Admitting: Adult Health

## 2019-02-06 ENCOUNTER — Encounter: Payer: Self-pay | Admitting: Adult Health

## 2019-02-06 ENCOUNTER — Other Ambulatory Visit: Payer: Self-pay

## 2019-02-06 ENCOUNTER — Telehealth: Payer: Self-pay | Admitting: Adult Health

## 2019-02-06 ENCOUNTER — Inpatient Hospital Stay: Payer: Medicare Other | Attending: Adult Health | Admitting: Adult Health

## 2019-02-06 VITALS — BP 133/67 | HR 73 | Temp 98.1°F | Resp 18 | Ht 67.0 in | Wt 136.6 lb

## 2019-02-06 DIAGNOSIS — Z801 Family history of malignant neoplasm of trachea, bronchus and lung: Secondary | ICD-10-CM | POA: Diagnosis not present

## 2019-02-06 DIAGNOSIS — E559 Vitamin D deficiency, unspecified: Secondary | ICD-10-CM | POA: Diagnosis not present

## 2019-02-06 DIAGNOSIS — Z170 Estrogen receptor positive status [ER+]: Secondary | ICD-10-CM | POA: Diagnosis not present

## 2019-02-06 DIAGNOSIS — I10 Essential (primary) hypertension: Secondary | ICD-10-CM | POA: Diagnosis not present

## 2019-02-06 DIAGNOSIS — Z803 Family history of malignant neoplasm of breast: Secondary | ICD-10-CM | POA: Insufficient documentation

## 2019-02-06 DIAGNOSIS — Z79899 Other long term (current) drug therapy: Secondary | ICD-10-CM | POA: Diagnosis not present

## 2019-02-06 DIAGNOSIS — C50512 Malignant neoplasm of lower-outer quadrant of left female breast: Secondary | ICD-10-CM

## 2019-02-06 DIAGNOSIS — Z853 Personal history of malignant neoplasm of breast: Secondary | ICD-10-CM | POA: Diagnosis not present

## 2019-02-06 DIAGNOSIS — Z780 Asymptomatic menopausal state: Secondary | ICD-10-CM | POA: Diagnosis not present

## 2019-02-06 DIAGNOSIS — Z0000 Encounter for general adult medical examination without abnormal findings: Secondary | ICD-10-CM | POA: Diagnosis not present

## 2019-02-06 DIAGNOSIS — Z923 Personal history of irradiation: Secondary | ICD-10-CM | POA: Insufficient documentation

## 2019-02-06 NOTE — Progress Notes (Signed)
CLINIC:  Survivorship   REASON FOR VISIT:  Routine follow-up for history of breast cancer.   BRIEF ONCOLOGIC HISTORY:  Oncology History  Malignant neoplasm of lower-outer quadrant of left breast of female, estrogen receptor positive ([place])  02/16/2017 Initial Diagnosis   Screening detected left breast mass 8 mm at 4 o'clock position axilla negative biopsy grade 2 invasive ductal carcinoma ER 100%, PR 100%, Ki-67 15%, HER-2 negative ratio 1.58, T1 BN 0 stage I a clinical stage   03/03/2017 Surgery   Left Lumpectomy:0.8 cm IDC grade 1, 0/3 LN neg,  ER 100%, PR 100%, Ki-67 15%, HER-2 negative ratio 1.58 T1bN0 Stage 1A   04/05/2017 - 05/02/2017 Radiation Therapy   Adjuvant radiation therapy    Anti-estrogen oral therapy   Recommended antiestrogen therapy but patient refused      INTERVAL HISTORY:  Ms. Bier presents to the [facility] today for routine follow-up for her history of breast cancer.  Overall, she reports feeling quite well.   Harper is feeling moderately well.  She has had a difficult past 4 years and notes she has had personal loss with her husband and parents.  She is interested in getting more info on our virtual yoga.  She is walking by walking regularly.  She sees her PCP regularly, Dr. Maudie Mercury.  She does not have regular visits with GYN or dermatology.  She no longer requires pap smears.  She underwent colon cancer screening several years ago.    Her most recent diagnostic mammogram was completed at [facility] on 09/20/2018 and showed no evidence of malignancy, breast density B.  A bilateral  F/u mammogram was recommended to be completed in 03/2019.   REVIEW OF SYSTEMS:  Review of Systems  Constitutional: Negative for appetite change, chills, fatigue, fever and unexpected weight change.  HENT:   Negative for hearing loss, lump/mass, mouth sores and sore throat.   Eyes: Negative for eye problems and icterus.  Respiratory: Negative for chest tightness, cough and  shortness of breath.   Cardiovascular: Negative for chest pain, leg swelling and palpitations.  Gastrointestinal: Negative for abdominal distention, abdominal pain, constipation, diarrhea, nausea and vomiting.  Endocrine: Negative for hot flashes.  Genitourinary: Negative for difficulty urinating.   Musculoskeletal: Negative for arthralgias.  Skin: Negative for itching and rash.  Neurological: Negative for dizziness, extremity weakness, headaches and numbness.  Hematological: Negative for adenopathy. Does not bruise/bleed easily.  Psychiatric/Behavioral: Negative for depression. The patient is not nervous/anxious.   Breast: Denies any new nodularity, masses, tenderness, nipple changes, or nipple discharge.       PAST MEDICAL/SURGICAL HISTORY:  Past Medical History:  Diagnosis Date   Cancer ([place])    breast Ca- L     Heart murmur    Hypertension    Pneumonia    hosp.- as a child   Past Surgical History:  Procedure Laterality Date   BREAST LUMPECTOMY WITH RADIOACTIVE SEED AND SENTINEL LYMPH NODE BIOPSY Left 03/03/2017   Procedure: RADIOCATIVE SEED GUIDED LEFT BREAST LUMPECTOMY WITH LEFT AXILLARY SENTINEL LYMPH NODE BIOPSY;  Surgeon: Excell Seltzer, MD;  Location: [facility];  Service: General;  Laterality: Left;   OOPHORECTOMY Right      ALLERGIES:  Allergies  Allergen Reactions   Codeine Itching     CURRENT MEDICATIONS:  Outpatient Encounter Medications as of 02/06/2019  Medication Sig   amLODipine (NORVASC) 10 MG tablet Take 10 mg by mouth daily.   [DISCONTINUED] acetaminophen (TYLENOL) 500 MG tablet Take 1,000 mg by mouth every  6 (six) hours as needed.   No facility-administered encounter medications on file as of 02/06/2019.      ONCOLOGIC FAMILY HISTORY:  Family History  Problem Relation Age of Onset   Cancer Mother        melanoma   Alzheimer's disease Mother    Cancer Father        prostate   Breast cancer Paternal Grandmother 73   Breast  cancer Cousin 51   Cancer Paternal Aunt        lung   Cancer Paternal Uncle        lung   Cancer Paternal Grandfather        lung    GENETIC COUNSELING/TESTING: Not at this time--declined referral  SOCIAL HISTORY:  Social History   Socioeconomic History   Marital status: Widowed    Spouse name: Not on file   Number of children: 0   Years of education: Not on file   Highest education level: Not on file  Occupational History   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  Social Designer, fashion/clothing strain: Not on file   Food insecurity    Worry: Not on file    Inability: Not on file   Transportation needs    Medical: Not on file    Non-medical: Not on file  Tobacco Use   Smoking status: Never Smoker   Smokeless tobacco: Never Used  Substance and Sexual Activity   Alcohol use: Yes    Alcohol/week: 7.0 standard drinks    Types: 7 Glasses of wine per week    Comment: 1 glass of red wine /day    Drug use: No   Sexual activity: Not Currently  Lifestyle   Physical activity    Days per week: Not on file    Minutes per session: Not on file   Stress: Not on file  Relationships   Social connections    Talks on phone: Not on file    Gets together: Not on file    Attends religious service: Not on file    Active member of club or organization: Not on file    Attends meetings of clubs or organizations: Not on file    Relationship status: Not on file   Intimate partner violence    Fear of current or ex partner: Not on file    Emotionally abused: Not on file    Physically abused: Not on file    Forced sexual activity: Not on file  Other Topics Concern   Not on file  Social History Narrative   Widowed. No children.   Primary caregiver for mother who suffered from [place]. Mother has since passed away.   Currently primary caregiver for 28 year old father.     PHYSICAL EXAMINATION:  Vital Signs: Vitals:   02/06/19  1004  BP: 133/67  Pulse: 73  Resp: 18  Temp: 98.1 F (36.7 C)  SpO2: 100%   Filed Weights   02/06/19 1004  Weight: 136 lb 9.6 oz (62 kg)   General: Well-nourished, well-appearing female in no acute distress.  Unaccompanied today.   HEENT: Head is normocephalic.  Pupils equal and reactive to light. Conjunctivae clear without exudate.  Sclerae anicteric. Oral mucosa is pink, moist.  Oropharynx is pink without lesions or erythema.  Lymph: No cervical, supraclavicular, or infraclavicular lymphadenopathy noted on palpation.  Cardiovascular: Regular rate and rhythm.Marland Kitchen Respiratory: Clear to auscultation bilaterally. Chest expansion symmetric; breathing non-labored.  Breast Exam:  -Left breast: No appreciable masses on palpation. No skin redness, thickening, or peau d'orange appearance; no nipple retraction or nipple discharge; mild distortion in symmetry at previous lumpectomy site well healed scar without erythema or nodularity.  -Right breast: No appreciable masses on palpation. No skin redness, thickening, or peau d'orange appearance; no nipple retraction or nipple discharge. -Axilla: No axillary adenopathy bilaterally.  GI: Abdomen soft and round; non-tender, non-distended. Bowel sounds normoactive. No hepatosplenomegaly.   GU: Deferred.  Neuro: No focal deficits. Steady gait.  Psych: Mood and affect normal and appropriate for situation.  MSK: No focal spinal tenderness to palpation, full range of motion in bilateral upper extremities Extremities: No edema. Skin: Warm and dry.  LABORATORY DATA:  None for this visit   DIAGNOSTIC IMAGING:  Most recent mammogram:   Normal at [facility] and scanned in epic    ASSESSMENT AND PLAN:  Ms.. Acocella is a pleasant 68 y.o. female with history of Stage IA left breast invasive ductal carcinoma, ER+/PR+/HER2-, diagnosed in 02/2017, treated with lumpectomy and adjuvant radiation therapy.  [facility] for [facility] and  routine follow-up.   1. History of breast cancer:  Ms. Segall is currently clinically and radiographically without evidence of disease or recurrence of breast cancer.  We reviewed her follow up recommendations and she will see Dr. Brantley Stage in 6 months, and see [place] back in 02/2020.  She understands this f/u approach and is in agreement with it.  I also gave her information on our support services programs that are available virtually due to [place].  She was very appreciative of this information to help with her feeling isolated.  I encouraged her to call me with any questions or concerns before her next visit at the cancer center, and I would be happy to see her sooner, if needed.    2. Bone health:  Given Ms. Burlingame's age, history of breast cancer, she is at risk for bone demineralization.  She was given education on specific food and activities to promote bone health.  3. Cancer screening:  Due to Ms. Modica's history and her age, she should receive screening for skin cancers, colon cancer. She was encouraged to follow-up with her PCP for appropriate cancer screenings.   4. Health maintenance and wellness promotion: Ms. Medellin was encouraged to consume 5-7 servings of fruits and vegetables per day. She was also encouraged to engage in moderate to vigorous exercise for 30 minutes per day most days of the week. She was instructed to limit her alcohol consumption and continue to abstain from tobacco use.      Dispo:  -Return to cancer center in one year for f/u with Dr. Lindi Adie -LTS follow up in two years -Mammogram in 03/2019 -Follow-up with surgery in 08/2019  A total of (30) minutes of face-to-face time was spent with this patient with greater than 50% of that time in counseling and care-coordination.   Gardenia Phlegm, [place] [phone]   Note: PRIMARY CARE PROVIDER Jani Gravel, [place] [phone]

## 2019-02-06 NOTE — Telephone Encounter (Signed)
I talk with patient regarding schedule  

## 2019-03-19 ENCOUNTER — Encounter: Payer: Self-pay | Admitting: Adult Health

## 2019-08-21 ENCOUNTER — Encounter: Payer: Self-pay | Admitting: Dermatology

## 2019-08-21 ENCOUNTER — Ambulatory Visit (INDEPENDENT_AMBULATORY_CARE_PROVIDER_SITE_OTHER): Payer: Medicare Other | Admitting: Dermatology

## 2019-08-21 ENCOUNTER — Other Ambulatory Visit: Payer: Self-pay

## 2019-08-21 DIAGNOSIS — L570 Actinic keratosis: Secondary | ICD-10-CM

## 2019-08-21 DIAGNOSIS — D229 Melanocytic nevi, unspecified: Secondary | ICD-10-CM

## 2019-08-21 DIAGNOSIS — L309 Dermatitis, unspecified: Secondary | ICD-10-CM

## 2019-08-21 DIAGNOSIS — L308 Other specified dermatitis: Secondary | ICD-10-CM

## 2019-08-21 DIAGNOSIS — D225 Melanocytic nevi of trunk: Secondary | ICD-10-CM | POA: Diagnosis not present

## 2019-08-22 ENCOUNTER — Telehealth: Payer: Self-pay | Admitting: Dermatology

## 2019-08-22 MED ORDER — CLOBETASOL PROPIONATE 0.05 % EX OINT: 1.0000 "application " | TOPICAL_OINTMENT | Freq: Two times a day (BID) | CUTANEOUS | 2 refills | Status: DC

## 2019-08-22 NOTE — Telephone Encounter (Signed)
Said Rx for clobetasol (prefers ointment) was supposed to be called in yesterday but wasn't. [facility] [address] Said she thought it was sent in to [facility], but she went there & it wasn't. ST patient

## 2019-08-22 NOTE — Telephone Encounter (Signed)
[facility]int[facility] to [facility] per Dr Denna Haggard.

## 2019-08-26 ENCOUNTER — Telehealth: Payer: Self-pay | Admitting: Dermatology

## 2019-08-26 NOTE — Telephone Encounter (Signed)
Patient called back to say that she found the prescription cheaper at [facility] on [facility] [address] [facility] and would like it sent there today.

## 2019-08-26 NOTE — Telephone Encounter (Signed)
Patient left message on office voice mail that the Clobetasol prescription was $200, so she did not pick it up.  Nichole Ortega does want to pick up a written prescription for the Clobetasol around 10:30.  (Chart [phone])

## 2019-08-27 MED ORDER — CLOBETASOL PROPIONATE 0.05 % EX OINT: 1.0000 "application " | TOPICAL_OINTMENT | Freq: Two times a day (BID) | CUTANEOUS | 2 refills | Status: DC

## 2019-08-27 MED ORDER — CLOBETASOL PROPIONATE 0.05 % EX OINT: 1.0000 "application " | TOPICAL_OINTMENT | Freq: Two times a day (BID) | CUTANEOUS | 2 refills | Status: AC

## 2019-08-27 NOTE — Telephone Encounter (Signed)
Spoke with patient to let her know I [facility] once I spoke with her she had already changed her mind because it was cheaper at [facility] so I recent clobetasol cream to Nichole Ortega and discontinued prescription at [facility].

## 2019-08-27 NOTE — Telephone Encounter (Signed)
Message from patient stating clobetasol cream cheaper at [facility] and wants [place] to send prescription there : prescription sent to [facility] : patient notified

## 2019-09-21 ENCOUNTER — Encounter: Payer: Self-pay | Admitting: Dermatology

## 2019-09-21 NOTE — Progress Notes (Signed)
   New Patient   Subjective  Amos Gaber is a 69 y.o. female who presents for the following: Skin Problem (Check spot on bottom lip. x few months. No pain or bleeding. Check spot on face as well. ) and Eczema (Patient also has eczema on hands that flares up. Patient thinks it stress related. Treament for flares is clobetasol. Need new prescription. ).  Eczema Location: Hands Duration: Years Quality:  Associated Signs/Symptoms: Modifying Factors: Clobetasol helps Severity:  Timing: Context:    The following portions of the chart were reviewed this encounter and updated as appropriate: Tobacco  Allergies  Meds  Problems  Med Hx  Surg Hx  Fam Hx      Objective  Well appearing patient in no apparent distress; mood and affect are within normal limits.  All sun exposed areas plus back examined.   Assessment & Plan  AK (actinic keratosis) (2) Mid Lower Vermilion Lip; Left Buccal Cheek   Destruction of lesion - Left Buccal Cheek , Mid Lower Vermilion Lip Complexity: simple   Destruction method: cryotherapy   Informed consent: discussed and consent obtained   Timeout:  patient name, date of birth, surgical site, and procedure verified Lesion destroyed using liquid nitrogen: Yes   Region frozen until ice ball extended beyond lesion: Yes   Cryotherapy cycles:  5 Outcome: patient tolerated procedure well with no complications    Hand eczema Right Mid Palm  Detailed review of role of irritants.  Told this was a treatable and curable condition.  Emphasis need for hydration.  We will try to use clobetasol only for flares.  Nevus Mid Back  Annual skin examination  Skin / nail biopsy - Mid Back

## 2019-10-02 ENCOUNTER — Other Ambulatory Visit: Payer: Self-pay

## 2019-10-02 ENCOUNTER — Encounter: Payer: Self-pay | Admitting: Dermatology

## 2019-10-02 ENCOUNTER — Ambulatory Visit (INDEPENDENT_AMBULATORY_CARE_PROVIDER_SITE_OTHER): Payer: Medicare Other | Admitting: Dermatology

## 2019-10-02 DIAGNOSIS — B079 Viral wart, unspecified: Secondary | ICD-10-CM

## 2019-10-02 DIAGNOSIS — L570 Actinic keratosis: Secondary | ICD-10-CM

## 2019-11-11 ENCOUNTER — Encounter: Payer: Self-pay | Admitting: Dermatology

## 2019-11-11 NOTE — Progress Notes (Signed)
   Follow-Up Visit   Subjective  Nichole Ortega is a 69 y.o. female who presents for the following: Follow-up (Pt stated--spot lower lip is looking better and smaller. Denied pain.).  Pump Location: Left cheek Duration:  Quality:  Associated Signs/Symptoms: Modifying Factors:  Severity:  Timing: Context:   Objective  Well appearing patient in no apparent distress; mood and affect are within normal limits.  A focused examination was performed including Head and neck.. Relevant physical exam findings are noted in the Assessment and Plan.   Assessment & Plan    Viral warts, unspecified type Left Parotid Area  Viral wart vs other - Discussed treatment options. No treatemnt recommended today. Recheck on follow up.  AK (actinic keratosis) Lips  To return if bump or crust reappears.     I, Lavonna Monarch, MD, have reviewed all documentation for this visit.  The documentation on 11/11/19 for the exam, diagnosis, procedures, and orders are all accurate and complete.

## 2019-11-18 DIAGNOSIS — C50912 Malignant neoplasm of unspecified site of left female breast: Secondary | ICD-10-CM | POA: Diagnosis not present

## 2019-11-18 DIAGNOSIS — Z170 Estrogen receptor positive status [ER+]: Secondary | ICD-10-CM | POA: Diagnosis not present

## 2019-12-02 DIAGNOSIS — Z23 Encounter for immunization: Secondary | ICD-10-CM | POA: Diagnosis not present

## 2020-01-21 DIAGNOSIS — Z23 Encounter for immunization: Secondary | ICD-10-CM | POA: Diagnosis not present

## 2020-01-29 DIAGNOSIS — I10 Essential (primary) hypertension: Secondary | ICD-10-CM | POA: Diagnosis not present

## 2020-01-29 DIAGNOSIS — E559 Vitamin D deficiency, unspecified: Secondary | ICD-10-CM | POA: Diagnosis not present

## 2020-02-05 NOTE — Assessment & Plan Note (Signed)
03/03/18:Left Lumpectomy:0.8 cm IDC grade 1, 0/3 LN neg, ER 100%, PR 100%, Ki-67 15%, HER-2 negative ratio 1.58 T1bN0 Stage 1A Adjuvant radiation 04/05/2017-05/02/2017  Treatment plan:We recommended letrozole but patient refused to take it.  Surveillance: 1. Breast exam 02/06/20: Benign 2. Mammograms 03/19/19. at [place].  Breast density category B  RTC in 1 year

## 2020-02-05 NOTE — Progress Notes (Signed)
Patient Care Team: Jani Gravel, MD as PCP - General (Internal Medicine) Nicholas Lose, MD as Consulting Physician (Hematology and Oncology) Kyung Rudd, MD as Consulting Physician (Radiation Oncology) Gardenia Phlegm, NP as Nurse Practitioner (Hematology and Oncology) Lavonna Monarch, MD as Consulting Physician (Dermatology)  DIAGNOSIS:    ICD-10-CM   1. Malignant neoplasm of lower-outer quadrant of left breast of female, estrogen receptor positive ([place])  C50.512    Z17.0     SUMMARY OF ONCOLOGIC HISTORY: Oncology History  Malignant neoplasm of lower-outer quadrant of left breast of female, estrogen receptor positive ([place])  02/16/2017 Initial Diagnosis   Screening detected left breast mass 8 mm at 4 o'clock position axilla negative biopsy grade 2 invasive ductal carcinoma ER 100%, PR 100%, Ki-67 15%, HER-2 negative ratio 1.58, T1 BN 0 stage I a clinical stage   03/03/2017 Surgery   Left Lumpectomy:0.8 cm IDC grade 1, 0/3 LN neg,  ER 100%, PR 100%, Ki-67 15%, HER-2 negative ratio 1.58 T1bN0 Stage 1A   04/05/2017 - 05/02/2017 Radiation Therapy   Adjuvant radiation therapy    Anti-estrogen oral therapy   Recommended antiestrogen therapy but patient refused     CHIEF COMPLIANT: Surveillance of breast cancer  INTERVAL HISTORY: Nichole Ortega is a 69 y.o. with above-mentioned history of left breast cancer who underwent a lumpectomy, radiation, and refused antiestrogen therapy. Mammogram on 03/19/19 showed no evidence of malignancy bilaterally. She presents to the clinic today for annual follow-up.  Since her husband passed away in 2014/05/19 and her parents also passed away recently she has been focusing on her own mental health and she loves to boating.  [place] and she has began to love boating a lot and wants to do it more often.  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  ALLERGIES:  is allergic to  codeine.  MEDICATIONS:  Current Outpatient Medications  Medication Sig Dispense Refill  . acyclovir (ZOVIRAX) 200 MG capsule acyclovir 200 mg capsule  TAKE 1 CAPSULE BY MOUTH THREE TIMES DAILY FOR 4 DAYS AS DIRECTED    . amLODipine (NORVASC) 10 MG tablet Take 10 mg by mouth daily.    . clobetasol ointment (TEMOVATE) 1.74 % Apply 1 application topically 2 (two) times daily. (Patient not taking: Reported on 10/02/2019) 60 g 2   No current facility-administered medications for this visit.    PHYSICAL EXAMINATION: ECOG PERFORMANCE STATUS: 1 - Symptomatic but completely ambulatory  Vitals:   02/06/20 1025  BP: 126/60  Pulse: 77  Resp: 17  Temp: 98.3 F (36.8 C)  SpO2: 99%   Filed Weights   02/06/20 1025  Weight: 137 lb 14.4 oz (62.6 kg)    LABORATORY DATA:  I have reviewed the data as listed CMP Latest Ref Rng & Units 08/04/2017 03/01/2017 02/22/2017  Glucose 70 - 140 mg/dL 89 91 137  BUN 7 - 26 mg/dL 13 9 11.1  Creatinine 0.60 - 1.10 mg/dL 0.64 0.50 0.9  Sodium 136 - 145 mmol/L 132(L) 132(L) 134(L)  Potassium 3.5 - 5.1 mmol/L 4.2 4.2 4.1  Chloride 98 - 109 mmol/L 95(L) 95(L) -  CO2 22 - 29 mmol/L 28 25 29   Calcium 8.4 - 10.4 mg/dL 9.6 9.8 10.0  Total Protein 6.4 - 8.3 g/dL 7.0 - 7.5  Total Bilirubin 0.2 - 1.2 mg/dL 0.9 - 0.96  Alkaline Phos 40 - 150 U/L 56 - 63  AST 5 - 34 U/L 25 -  21  ALT 0 - 55 U/L 23 - 21    Lab Results  Component Value Date   WBC 6.8 03/01/2017   HGB 13.8 03/01/2017   HCT 41.5 03/01/2017   MCV 95.6 03/01/2017   PLT 326 03/01/2017   NEUTROABS 3.8 02/22/2017    ASSESSMENT & PLAN:  Malignant neoplasm of lower-outer quadrant of left breast of female, estrogen receptor positive ([facility]) 03/03/18:Left Lumpectomy:0.8 cm IDC grade 1, 0/3 LN neg, ER 100%, PR 100%, Ki-67 15%, HER-2 negative ratio 1.58 T1bN0 Stage 1A Adjuvant radiation 04/05/2017-05/02/2017  Treatment plan:We recommended letrozole but patient refused to take it.  Surveillance: 1.  Breast exam 02/06/20: Benign 2. Mammograms 03/19/19. at [place].  Breast density category B  Patient stays very busy with the boating and recreation as well as nature [place] activities.  RTC in 1 year    No orders of the defined types were placed in this encounter.  The patient has a good understanding of the overall plan. she agrees with it. she will call with any problems that may develop before the next visit here.  Total time spent: 20 mins including face to face time and time spent for planning, charting and coordination of care  Nicholas Lose, MD 02/06/2020  I, Cloyde Reams Dorshimer, am acting as scribe for Dr. Nicholas Lose.  I have reviewed the above documentation for accuracy and completeness, and I agree with the above.

## 2020-02-06 ENCOUNTER — Other Ambulatory Visit: Payer: Self-pay

## 2020-02-06 ENCOUNTER — Inpatient Hospital Stay: Payer: Medicare Other | Attending: Hematology and Oncology | Admitting: Hematology and Oncology

## 2020-02-06 ENCOUNTER — Telehealth: Payer: Self-pay | Admitting: Hematology and Oncology

## 2020-02-06 DIAGNOSIS — Z923 Personal history of irradiation: Secondary | ICD-10-CM | POA: Diagnosis not present

## 2020-02-06 DIAGNOSIS — Z801 Family history of malignant neoplasm of trachea, bronchus and lung: Secondary | ICD-10-CM | POA: Insufficient documentation

## 2020-02-06 DIAGNOSIS — I10 Essential (primary) hypertension: Secondary | ICD-10-CM | POA: Insufficient documentation

## 2020-02-06 DIAGNOSIS — C50512 Malignant neoplasm of lower-outer quadrant of left female breast: Secondary | ICD-10-CM | POA: Diagnosis not present

## 2020-02-06 DIAGNOSIS — Z853 Personal history of malignant neoplasm of breast: Secondary | ICD-10-CM | POA: Insufficient documentation

## 2020-02-06 DIAGNOSIS — Z803 Family history of malignant neoplasm of breast: Secondary | ICD-10-CM | POA: Insufficient documentation

## 2020-02-06 DIAGNOSIS — Z79899 Other long term (current) drug therapy: Secondary | ICD-10-CM | POA: Insufficient documentation

## 2020-02-06 DIAGNOSIS — Z170 Estrogen receptor positive status [ER+]: Secondary | ICD-10-CM | POA: Diagnosis not present

## 2020-02-06 NOTE — Telephone Encounter (Signed)
Scheduled appts per 12/2 los. Gave pt a print out of AVS.

## 2020-02-10 DIAGNOSIS — K130 Diseases of lips: Secondary | ICD-10-CM | POA: Diagnosis not present

## 2020-02-10 DIAGNOSIS — Z0000 Encounter for general adult medical examination without abnormal findings: Secondary | ICD-10-CM | POA: Diagnosis not present

## 2020-02-10 DIAGNOSIS — I10 Essential (primary) hypertension: Secondary | ICD-10-CM | POA: Diagnosis not present

## 2020-02-10 DIAGNOSIS — Z13220 Encounter for screening for lipoid disorders: Secondary | ICD-10-CM | POA: Diagnosis not present

## 2020-02-10 DIAGNOSIS — Z853 Personal history of malignant neoplasm of breast: Secondary | ICD-10-CM | POA: Diagnosis not present

## 2020-02-10 DIAGNOSIS — E559 Vitamin D deficiency, unspecified: Secondary | ICD-10-CM | POA: Diagnosis not present

## 2020-02-11 ENCOUNTER — Ambulatory Visit (INDEPENDENT_AMBULATORY_CARE_PROVIDER_SITE_OTHER): Payer: Medicare Other | Admitting: Dermatology

## 2020-02-11 ENCOUNTER — Other Ambulatory Visit: Payer: Self-pay

## 2020-02-11 ENCOUNTER — Encounter: Payer: Self-pay | Admitting: Dermatology

## 2020-02-11 DIAGNOSIS — B078 Other viral warts: Secondary | ICD-10-CM | POA: Diagnosis not present

## 2020-02-11 DIAGNOSIS — D485 Neoplasm of uncertain behavior of skin: Secondary | ICD-10-CM

## 2020-02-11 NOTE — Patient Instructions (Signed)

## 2020-02-23 ENCOUNTER — Encounter: Payer: Self-pay | Admitting: Dermatology

## 2020-02-23 NOTE — Progress Notes (Signed)
   Follow-Up Visit   Subjective  Triana Coover is a 69 y.o. female who presents for the following: Follow-up (lip- wart- "about the same"- LN2 last time).  Growth Location: Lower lip Duration:  Quality:  Associated Signs/Symptoms: Modifying Factors: Not clear with freezing Severity:  Timing: Context:   Objective  Well appearing patient in no apparent distress; mood and affect are within normal limits. Objective  Mid Lower Vermilion Lip: White raised 2 mm slightly verrucous papule       A focused examination was performed including Head and neck.. Relevant physical exam findings are noted in the Assessment and Plan.   Assessment & Plan    Neoplasm of uncertain behavior of skin Mid Lower Vermilion Lip  Skin / nail biopsy Type of biopsy: tangential   Informed consent: discussed and consent obtained   Timeout: patient name, date of birth, surgical site, and procedure verified   Procedure prep:  Patient was prepped and draped in usual sterile fashion (Non sterile) Prep type:  Chlorhexidine Anesthesia: the lesion was anesthetized in a standard fashion   Anesthetic:  1% lidocaine w/ epinephrine 1-100,000 local infiltration Instrument used: flexible razor blade   Outcome: patient tolerated procedure well   Post-procedure details: wound care instructions given   Additional details:  Cautery only  Specimen 1 - Surgical pathology Differential Diagnosis: wart, bcc vs scc Check Margins: No      I, Lavonna Monarch, MD, have reviewed all documentation for this visit.  The documentation on 02/23/20 for the exam, diagnosis, procedures, and orders are all accurate and complete.

## 2020-02-25 ENCOUNTER — Telehealth: Payer: Self-pay | Admitting: Dermatology

## 2020-02-25 NOTE — Telephone Encounter (Signed)
Patient is calling for her pathology results from her last visit with Lavonna Monarch, MD.

## 2020-03-02 ENCOUNTER — Telehealth: Payer: Self-pay

## 2020-03-02 NOTE — Telephone Encounter (Signed)
PATH GIVEN TO PATIENT

## 2020-03-19 DIAGNOSIS — Z853 Personal history of malignant neoplasm of breast: Secondary | ICD-10-CM | POA: Diagnosis not present

## 2020-04-28 DIAGNOSIS — H25013 Cortical age-related cataract, bilateral: Secondary | ICD-10-CM | POA: Diagnosis not present

## 2020-04-28 DIAGNOSIS — H43813 Vitreous degeneration, bilateral: Secondary | ICD-10-CM | POA: Diagnosis not present

## 2020-04-28 DIAGNOSIS — H2513 Age-related nuclear cataract, bilateral: Secondary | ICD-10-CM | POA: Diagnosis not present

## 2020-04-28 DIAGNOSIS — H5203 Hypermetropia, bilateral: Secondary | ICD-10-CM | POA: Diagnosis not present

## 2020-04-30 ENCOUNTER — Ambulatory Visit (INDEPENDENT_AMBULATORY_CARE_PROVIDER_SITE_OTHER): Payer: Medicare Other | Admitting: Dermatology

## 2020-04-30 ENCOUNTER — Encounter: Payer: Self-pay | Admitting: Dermatology

## 2020-04-30 ENCOUNTER — Other Ambulatory Visit: Payer: Self-pay

## 2020-04-30 DIAGNOSIS — L720 Epidermal cyst: Secondary | ICD-10-CM

## 2020-04-30 DIAGNOSIS — L570 Actinic keratosis: Secondary | ICD-10-CM

## 2020-04-30 DIAGNOSIS — B079 Viral wart, unspecified: Secondary | ICD-10-CM

## 2020-05-11 ENCOUNTER — Encounter: Payer: Self-pay | Admitting: Dermatology

## 2020-05-11 NOTE — Progress Notes (Signed)
   Follow-Up Visit   Subjective  Nichole Ortega is a 70 y.o. female who presents for the following: Follow-up (Right beside the last bx).  Recheck upper lip (biopsy showed verruca vulgaris). Also crust on left cheek can bumps on face. Location:  Duration:  Quality:  Associated Signs/Symptoms: Modifying Factors:  Severity:  Timing: Context:   Objective  Well appearing patient in no apparent distress; mood and affect are within normal limits. Objective  Left Parotid Area: Gritty pink crust  Objective  Head - Anterior (Face): Milia x 1 removed by Dr Denna Haggard at patient's request.  Objective  Left Upper Cutaneous Lip: Minimal persistent scale    A focused examination was performed including Head and neck.. Relevant physical exam findings are noted in the Assessment and Plan.   Assessment & Plan    AK (actinic keratosis) Left Parotid Area  Destruction of lesion - Left Parotid Area Complexity: simple   Destruction method: cryotherapy   Informed consent: discussed and consent obtained   Timeout:  patient name, date of birth, surgical site, and procedure verified Lesion destroyed using liquid nitrogen: Yes   Cryotherapy cycles:  3 Outcome: patient tolerated procedure well with no complications   Post-procedure details: wound care instructions given    Milia Head - Anterior (Face)  Acne/Milia surgery - Head - Anterior (Face) Procedure risks and benefits were discussed with the patient and verbal consent was obtained. Following prep of the skin on the forehead with an alcohol swab, extraction of milium was performed with a lancet. Vaseline ointment was applied to each site. The patient tolerated the procedure well.  Verruca vulgaris Left Upper Cutaneous Lip  I will defer treatment if clinically stable.      I, Lavonna Monarch, MD, have reviewed all documentation for this visit.  The documentation on 05/11/20 for the exam, diagnosis, procedures, and orders are all  accurate and complete.

## 2020-05-27 ENCOUNTER — Ambulatory Visit
Admission: RE | Admit: 2020-05-27 | Discharge: 2020-05-27 | Disposition: A | Discharge status: Home / Self Care | Payer: Medicare Other | Source: Ambulatory Visit | Attending: Internal Medicine | Admitting: Internal Medicine

## 2020-05-27 ENCOUNTER — Other Ambulatory Visit: Payer: Self-pay | Admitting: Internal Medicine

## 2020-05-27 DIAGNOSIS — K5792 Diverticulitis of intestine, part unspecified, without perforation or abscess without bleeding: Secondary | ICD-10-CM | POA: Diagnosis not present

## 2020-05-27 DIAGNOSIS — C50912 Malignant neoplasm of unspecified site of left female breast: Secondary | ICD-10-CM | POA: Diagnosis not present

## 2020-05-27 DIAGNOSIS — R1031 Right lower quadrant pain: Secondary | ICD-10-CM

## 2020-05-27 DIAGNOSIS — K5730 Diverticulosis of large intestine without perforation or abscess without bleeding: Secondary | ICD-10-CM | POA: Diagnosis not present

## 2020-05-27 DIAGNOSIS — R109 Unspecified abdominal pain: Secondary | ICD-10-CM | POA: Diagnosis not present

## 2020-05-29 DIAGNOSIS — Z1211 Encounter for screening for malignant neoplasm of colon: Secondary | ICD-10-CM | POA: Diagnosis not present

## 2020-05-29 DIAGNOSIS — K5792 Diverticulitis of intestine, part unspecified, without perforation or abscess without bleeding: Secondary | ICD-10-CM | POA: Diagnosis not present

## 2020-08-06 ENCOUNTER — Telehealth: Payer: Self-pay | Admitting: Hematology and Oncology

## 2020-08-06 NOTE — Telephone Encounter (Signed)
R/s per prov pal, per 12/12 los

## 2020-08-18 ENCOUNTER — Ambulatory Visit: Payer: Medicare Other | Admitting: Dermatology

## 2020-08-25 DIAGNOSIS — Z23 Encounter for immunization: Secondary | ICD-10-CM | POA: Diagnosis not present

## 2020-09-05 DIAGNOSIS — S5002XA Contusion of left elbow, initial encounter: Secondary | ICD-10-CM | POA: Diagnosis not present

## 2020-10-29 DIAGNOSIS — K621 Rectal polyp: Secondary | ICD-10-CM | POA: Diagnosis not present

## 2020-10-29 DIAGNOSIS — Z1211 Encounter for screening for malignant neoplasm of colon: Secondary | ICD-10-CM | POA: Diagnosis not present

## 2020-10-29 DIAGNOSIS — K635 Polyp of colon: Secondary | ICD-10-CM | POA: Diagnosis not present

## 2020-10-29 DIAGNOSIS — K5730 Diverticulosis of large intestine without perforation or abscess without bleeding: Secondary | ICD-10-CM | POA: Diagnosis not present

## 2020-11-02 DIAGNOSIS — K635 Polyp of colon: Secondary | ICD-10-CM | POA: Diagnosis not present

## 2020-11-02 DIAGNOSIS — K621 Rectal polyp: Secondary | ICD-10-CM | POA: Diagnosis not present

## 2020-12-11 DIAGNOSIS — L853 Xerosis cutis: Secondary | ICD-10-CM | POA: Diagnosis not present

## 2020-12-17 DIAGNOSIS — Z23 Encounter for immunization: Secondary | ICD-10-CM | POA: Diagnosis not present

## 2020-12-28 DIAGNOSIS — Z23 Encounter for immunization: Secondary | ICD-10-CM | POA: Diagnosis not present

## 2021-02-03 DIAGNOSIS — R7309 Other abnormal glucose: Secondary | ICD-10-CM | POA: Diagnosis not present

## 2021-02-03 DIAGNOSIS — I10 Essential (primary) hypertension: Secondary | ICD-10-CM | POA: Diagnosis not present

## 2021-02-03 DIAGNOSIS — E039 Hypothyroidism, unspecified: Secondary | ICD-10-CM | POA: Diagnosis not present

## 2021-02-03 DIAGNOSIS — Z13220 Encounter for screening for lipoid disorders: Secondary | ICD-10-CM | POA: Diagnosis not present

## 2021-02-03 DIAGNOSIS — E559 Vitamin D deficiency, unspecified: Secondary | ICD-10-CM | POA: Diagnosis not present

## 2021-02-10 ENCOUNTER — Ambulatory Visit: Payer: Medicare Other | Admitting: Hematology and Oncology

## 2021-02-10 DIAGNOSIS — Z853 Personal history of malignant neoplasm of breast: Secondary | ICD-10-CM | POA: Diagnosis not present

## 2021-02-10 DIAGNOSIS — I10 Essential (primary) hypertension: Secondary | ICD-10-CM | POA: Diagnosis not present

## 2021-02-10 DIAGNOSIS — Z13220 Encounter for screening for lipoid disorders: Secondary | ICD-10-CM | POA: Diagnosis not present

## 2021-02-10 DIAGNOSIS — L309 Dermatitis, unspecified: Secondary | ICD-10-CM | POA: Diagnosis not present

## 2021-02-10 DIAGNOSIS — E559 Vitamin D deficiency, unspecified: Secondary | ICD-10-CM | POA: Diagnosis not present

## 2021-02-10 DIAGNOSIS — R7309 Other abnormal glucose: Secondary | ICD-10-CM | POA: Diagnosis not present

## 2021-02-10 DIAGNOSIS — R946 Abnormal results of thyroid function studies: Secondary | ICD-10-CM | POA: Diagnosis not present

## 2021-02-10 DIAGNOSIS — Z0000 Encounter for general adult medical examination without abnormal findings: Secondary | ICD-10-CM | POA: Diagnosis not present

## 2021-02-15 ENCOUNTER — Ambulatory Visit: Payer: Medicare Other | Admitting: Hematology and Oncology

## 2021-03-19 NOTE — Progress Notes (Signed)
Patient Care Team: Jani Gravel, MD as PCP - General (Internal Medicine) Nicholas Lose, MD as Consulting Physician (Hematology and Oncology) Kyung Rudd, MD as Consulting Physician (Radiation Oncology) Gardenia Phlegm, NP as Nurse Practitioner (Hematology and Oncology) Lavonna Monarch, MD as Consulting Physician (Dermatology)  DIAGNOSIS:    ICD-10-CM   1. Malignant neoplasm of lower-outer quadrant of left breast of female, estrogen receptor positive ([place])  C50.512    Z17.0       SUMMARY OF ONCOLOGIC HISTORY: Oncology History  Malignant neoplasm of lower-outer quadrant of left breast of female, estrogen receptor positive ([place])  02/16/2017 Initial Diagnosis   Screening detected left breast mass 8 mm at 4 o'clock position axilla negative biopsy grade 2 invasive ductal carcinoma ER 100%, PR 100%, Ki-67 15%, HER-2 negative ratio 1.58, T1 BN 0 stage I a clinical stage   03/03/2017 Surgery   Left Lumpectomy:0.8 cm IDC grade 1, 0/3 LN neg,  ER 100%, PR 100%, Ki-67 15%, HER-2 negative ratio 1.58 T1bN0 Stage 1A   04/05/2017 - 05/02/2017 Radiation Therapy   Adjuvant radiation therapy    Anti-estrogen oral therapy   Recommended antiestrogen therapy but patient refused     CHIEF COMPLIANT: Follow-up of left breast cancer  INTERVAL HISTORY: Nichole Ortega is a 71 y.o. with above-mentioned history of left breast cancer who underwent a lumpectomy, radiation, and refused antiestrogen therapy. Mammogram on 03/19/2020 showed no evidence of malignancy bilaterally. She presents to the clinic today for annual follow-up.  She continues to have intermittent pain in the breast at the site of surgery.  Denies any lumps or nodules in the breast.  ALLERGIES:  is allergic to codeine.  MEDICATIONS:  Current Outpatient Medications  Medication Sig Dispense Refill   acyclovir (ZOVIRAX) 200 MG capsule acyclovir 200 mg capsule  TAKE 1 CAPSULE BY MOUTH THREE TIMES DAILY FOR 4 DAYS AS DIRECTED      amLODipine (NORVASC) 10 MG tablet Take 10 mg by mouth daily.     clobetasol ointment (TEMOVATE) 5.05 % Apply 1 application topically 2 (two) times daily. 60 g 2   No current facility-administered medications for this visit.    PHYSICAL EXAMINATION: ECOG PERFORMANCE STATUS: 1 - Symptomatic but completely ambulatory  Vitals:   03/22/21 1147  BP: 137/61  Pulse: 72  Resp: 16  Temp: 97.7 F (36.5 C)  SpO2: 100%   Filed Weights   03/22/21 1147  Weight: 144 lb 1.6 oz (65.4 kg)    BREAST: No palpable masses or nodules in either right or left breasts. No palpable axillary supraclavicular or infraclavicular adenopathy no breast tenderness or nipple discharge. (exam performed in the presence of a chaperone)  LABORATORY DATA:  I have reviewed the data as listed CMP Latest Ref Rng & Units 08/04/2017 03/01/2017 02/22/2017  Glucose 70 - 140 mg/dL 89 91 137  BUN 7 - 26 mg/dL 13 9 11.1  Creatinine 0.60 - 1.10 mg/dL 0.64 0.50 0.9  Sodium 136 - 145 mmol/L 132(L) 132(L) 134(L)  Potassium 3.5 - 5.1 mmol/L 4.2 4.2 4.1  Chloride 98 - 109 mmol/L 95(L) 95(L) -  CO2 22 - 29 mmol/L 28 25 29   Calcium 8.4 - 10.4 mg/dL 9.6 9.8 10.0  Total Protein 6.4 - 8.3 g/dL 7.0 - 7.5  Total Bilirubin 0.2 - 1.2 mg/dL 0.9 - 0.96  Alkaline Phos 40 - 150 U/L 56 - 63  AST 5 - 34 U/L 25 - 21  ALT 0 - 55 U/L 23 - 21  Lab Results  Component Value Date   WBC 6.8 03/01/2017   HGB 13.8 03/01/2017   HCT 41.5 03/01/2017   MCV 95.6 03/01/2017   PLT 326 03/01/2017   NEUTROABS 3.8 02/22/2017    ASSESSMENT & PLAN:  Malignant neoplasm of lower-outer quadrant of left breast of female, estrogen receptor positive ([place]) 03/03/18: Left Lumpectomy:0.8 cm IDC grade 1, 0/3 LN neg,  ER 100%, PR 100%, Ki-67 15%, HER-2 negative ratio 1.58 T1bN0 Stage 1A Adjuvant radiation 04/05/2017-05/02/2017   Treatment plan: We recommended letrozole but patient refused to take it.   Surveillance: 1. Breast exam 03/22/21: Benign 2. Mammograms  03/19/20. at [place].  Breast density category B 3. CT Abd and pelvis: 05/27/2020: Diverticulitis   Patient stays very busy with the boating and recreation as well as nature [place] activities. She is excited about 2023 [place].  She is looking forward to exploring new trails.   RTC in 1 year    No orders of the defined types were placed in this encounter.  The patient has a good understanding of the overall plan. she agrees with it. she will call with any problems that may develop before the next visit here.  Total time spent: 20 mins including face to face time and time spent for planning, charting and coordination of care  Rulon Eisenmenger, MD, MPH 03/22/2021  I, Thana Ates, am acting as scribe for Dr. Nicholas Lose.  I have reviewed the above documentation for accuracy and completeness, and I agree with the above.

## 2021-03-21 NOTE — Assessment & Plan Note (Signed)
03/03/18:Left Lumpectomy:0.8 cm IDC grade 1, 0/3 LN neg, ER 100%, PR 100%, Ki-67 15%, HER-2 negative ratio 1.58 T1bN0 Stage 1A Adjuvant radiation 04/05/2017-05/02/2017  Treatment plan:We recommended letrozole but patient refused to take it.  Surveillance: 1.Breast exam 03/22/21: Benign 2.Mammograms1/13/22. at [place].Breast density category B 3. CT Abd and pelvis: 05/27/2020: Diverticulitis  Patient stays very busy with the boating and recreation as well as nature [place] activities.  RTC in 1 year

## 2021-03-22 ENCOUNTER — Other Ambulatory Visit: Payer: Self-pay

## 2021-03-22 ENCOUNTER — Inpatient Hospital Stay: Payer: Medicare Other | Attending: Hematology and Oncology | Admitting: Hematology and Oncology

## 2021-03-22 DIAGNOSIS — Z923 Personal history of irradiation: Secondary | ICD-10-CM | POA: Diagnosis not present

## 2021-03-22 DIAGNOSIS — Z79899 Other long term (current) drug therapy: Secondary | ICD-10-CM | POA: Insufficient documentation

## 2021-03-22 DIAGNOSIS — Z170 Estrogen receptor positive status [ER+]: Secondary | ICD-10-CM | POA: Diagnosis not present

## 2021-03-22 DIAGNOSIS — Z853 Personal history of malignant neoplasm of breast: Secondary | ICD-10-CM | POA: Diagnosis not present

## 2021-03-22 DIAGNOSIS — Z801 Family history of malignant neoplasm of trachea, bronchus and lung: Secondary | ICD-10-CM | POA: Insufficient documentation

## 2021-03-22 DIAGNOSIS — Z803 Family history of malignant neoplasm of breast: Secondary | ICD-10-CM | POA: Insufficient documentation

## 2021-03-22 DIAGNOSIS — C50512 Malignant neoplasm of lower-outer quadrant of left female breast: Secondary | ICD-10-CM

## 2021-03-22 DIAGNOSIS — I10 Essential (primary) hypertension: Secondary | ICD-10-CM | POA: Diagnosis not present

## 2021-03-22 DIAGNOSIS — R922 Inconclusive mammogram: Secondary | ICD-10-CM | POA: Diagnosis not present

## 2021-06-09 DIAGNOSIS — L309 Dermatitis, unspecified: Secondary | ICD-10-CM | POA: Diagnosis not present

## 2021-06-29 ENCOUNTER — Ambulatory Visit (INDEPENDENT_AMBULATORY_CARE_PROVIDER_SITE_OTHER): Payer: Medicare Other | Admitting: Dermatology

## 2021-06-29 ENCOUNTER — Encounter: Payer: Self-pay | Admitting: Dermatology

## 2021-06-29 DIAGNOSIS — L309 Dermatitis, unspecified: Secondary | ICD-10-CM

## 2021-06-29 MED ORDER — HYDROCORTISONE 0.5 % EX OINT: 1.0000 "application " | TOPICAL_OINTMENT | Freq: Every evening | CUTANEOUS | 1 refills | Status: DC

## 2021-06-29 MED ORDER — HYDROCORTISONE 2.5 % EX OINT: TOPICAL_OINTMENT | Freq: Two times a day (BID) | CUTANEOUS | 0 refills | Status: AC

## 2021-07-17 ENCOUNTER — Encounter: Payer: Self-pay | Admitting: Dermatology

## 2021-07-17 NOTE — Progress Notes (Signed)
? ?  Follow-Up Visit ?  ?Subjective  ?Nichole Ortega is a 71 y.o. female who presents for the following: Skin Problem (Patient here today for swelling and redness of her face x 1 month. Per patient she has sensitive skin and she hasn't changed anything. Patient states that her PCP did give her an injection of prednisone which helped with the swelling. Patient states she lives alone no animals in the home. ). ? ?Facial rash and swelling, improved with a cortisone shot ?Location:  ?Duration:  ?Quality:  ?Associated Signs/Symptoms: ?Modifying Factors:  ?Severity:  ?Timing: ?Context:  ? ?Objective  ?Well appearing patient in no apparent distress; mood and affect are within normal limits. ?Head - Anterior (Face) ?Photographs provided "tenderness dermatitis aspects contact dermatitis.  Minimal residual today.  We discussed scheduling patch testing but for now we will try to maintain improvement with topical therapy.  Discouraged from doing repeated systemic steroids. ? ? ? ?A focused examination was performed including head, neck, oral, lymph nodes.. Relevant physical exam findings are noted in the Assessment and Plan. ? ? ?Assessment & Plan  ? ? ?Dermatitis ?Head - Anterior (Face) ? ?Topical 20.5% hydrocortisone ointment nightly for the next 3 weeks, taper if doing well.  Follow-up by phone in 3 to 4 weeks. ? ?hydrocortisone 2.5 % ointment - Head - Anterior (Face) ?Apply topically 2 (two) times daily. ? ? ? ? ? ?I, Lavonna Monarch, MD, have reviewed all documentation for this visit.  The documentation on 07/17/21 for the exam, diagnosis, procedures, and orders are all accurate and complete. ?

## 2021-08-16 ENCOUNTER — Ambulatory Visit: Payer: Medicare Other | Admitting: Dermatology

## 2021-09-01 DIAGNOSIS — H2513 Age-related nuclear cataract, bilateral: Secondary | ICD-10-CM | POA: Diagnosis not present

## 2021-09-01 DIAGNOSIS — H524 Presbyopia: Secondary | ICD-10-CM | POA: Diagnosis not present

## 2021-09-01 DIAGNOSIS — H0100B Unspecified blepharitis left eye, upper and lower eyelids: Secondary | ICD-10-CM | POA: Diagnosis not present

## 2021-09-01 DIAGNOSIS — H0100A Unspecified blepharitis right eye, upper and lower eyelids: Secondary | ICD-10-CM | POA: Diagnosis not present

## 2021-11-11 DIAGNOSIS — H2511 Age-related nuclear cataract, right eye: Secondary | ICD-10-CM | POA: Diagnosis not present

## 2021-11-11 DIAGNOSIS — H25011 Cortical age-related cataract, right eye: Secondary | ICD-10-CM | POA: Diagnosis not present

## 2021-11-11 DIAGNOSIS — H52221 Regular astigmatism, right eye: Secondary | ICD-10-CM | POA: Diagnosis not present

## 2021-11-11 DIAGNOSIS — H25811 Combined forms of age-related cataract, right eye: Secondary | ICD-10-CM | POA: Diagnosis not present

## 2021-11-25 DIAGNOSIS — H2512 Age-related nuclear cataract, left eye: Secondary | ICD-10-CM | POA: Diagnosis not present

## 2021-11-25 DIAGNOSIS — H25812 Combined forms of age-related cataract, left eye: Secondary | ICD-10-CM | POA: Diagnosis not present

## 2021-11-25 DIAGNOSIS — H52222 Regular astigmatism, left eye: Secondary | ICD-10-CM | POA: Diagnosis not present

## 2021-11-25 DIAGNOSIS — H269 Unspecified cataract: Secondary | ICD-10-CM | POA: Diagnosis not present

## 2021-11-25 DIAGNOSIS — H25012 Cortical age-related cataract, left eye: Secondary | ICD-10-CM | POA: Diagnosis not present

## 2021-12-09 DIAGNOSIS — Z23 Encounter for immunization: Secondary | ICD-10-CM | POA: Diagnosis not present

## 2021-12-20 DIAGNOSIS — Z23 Encounter for immunization: Secondary | ICD-10-CM | POA: Diagnosis not present

## 2022-02-09 DIAGNOSIS — E559 Vitamin D deficiency, unspecified: Secondary | ICD-10-CM | POA: Diagnosis not present

## 2022-02-09 DIAGNOSIS — I10 Essential (primary) hypertension: Secondary | ICD-10-CM | POA: Diagnosis not present

## 2022-02-09 DIAGNOSIS — R7309 Other abnormal glucose: Secondary | ICD-10-CM | POA: Diagnosis not present

## 2022-02-09 DIAGNOSIS — R946 Abnormal results of thyroid function studies: Secondary | ICD-10-CM | POA: Diagnosis not present

## 2022-02-09 DIAGNOSIS — Z13220 Encounter for screening for lipoid disorders: Secondary | ICD-10-CM | POA: Diagnosis not present

## 2022-02-16 DIAGNOSIS — Z13220 Encounter for screening for lipoid disorders: Secondary | ICD-10-CM | POA: Diagnosis not present

## 2022-02-16 DIAGNOSIS — Z0000 Encounter for general adult medical examination without abnormal findings: Secondary | ICD-10-CM | POA: Diagnosis not present

## 2022-02-16 DIAGNOSIS — L309 Dermatitis, unspecified: Secondary | ICD-10-CM | POA: Diagnosis not present

## 2022-02-16 DIAGNOSIS — M8580 Other specified disorders of bone density and structure, unspecified site: Secondary | ICD-10-CM | POA: Diagnosis not present

## 2022-02-16 DIAGNOSIS — L719 Rosacea, unspecified: Secondary | ICD-10-CM | POA: Diagnosis not present

## 2022-02-16 DIAGNOSIS — Z853 Personal history of malignant neoplasm of breast: Secondary | ICD-10-CM | POA: Diagnosis not present

## 2022-02-16 DIAGNOSIS — Z79899 Other long term (current) drug therapy: Secondary | ICD-10-CM | POA: Diagnosis not present

## 2022-02-16 DIAGNOSIS — Z780 Asymptomatic menopausal state: Secondary | ICD-10-CM | POA: Diagnosis not present

## 2022-02-16 DIAGNOSIS — E559 Vitamin D deficiency, unspecified: Secondary | ICD-10-CM | POA: Diagnosis not present

## 2022-02-16 DIAGNOSIS — R946 Abnormal results of thyroid function studies: Secondary | ICD-10-CM | POA: Diagnosis not present

## 2022-02-16 DIAGNOSIS — I10 Essential (primary) hypertension: Secondary | ICD-10-CM | POA: Diagnosis not present

## 2022-02-16 DIAGNOSIS — R7309 Other abnormal glucose: Secondary | ICD-10-CM | POA: Diagnosis not present

## 2022-03-22 ENCOUNTER — Other Ambulatory Visit: Payer: Self-pay

## 2022-03-22 ENCOUNTER — Inpatient Hospital Stay: Payer: Medicare Other | Attending: Hematology and Oncology | Admitting: Hematology and Oncology

## 2022-03-22 VITALS — BP 149/71 | HR 99 | Temp 97.2°F | Resp 17 | Wt 151.4 lb

## 2022-03-22 DIAGNOSIS — Z853 Personal history of malignant neoplasm of breast: Secondary | ICD-10-CM | POA: Insufficient documentation

## 2022-03-22 DIAGNOSIS — Z79899 Other long term (current) drug therapy: Secondary | ICD-10-CM | POA: Insufficient documentation

## 2022-03-22 DIAGNOSIS — C50512 Malignant neoplasm of lower-outer quadrant of left female breast: Secondary | ICD-10-CM | POA: Diagnosis not present

## 2022-03-22 DIAGNOSIS — Z923 Personal history of irradiation: Secondary | ICD-10-CM | POA: Diagnosis not present

## 2022-03-22 DIAGNOSIS — Z170 Estrogen receptor positive status [ER+]: Secondary | ICD-10-CM | POA: Diagnosis not present

## 2022-03-22 NOTE — Progress Notes (Signed)
Patient Care Team: Jani Gravel, MD as PCP - General (Internal Medicine) Nicholas Lose, MD as Consulting Physician (Hematology and Oncology) Kyung Rudd, MD as Consulting Physician (Radiation Oncology) Delice Bison Charlestine Massed, NP as Nurse Practitioner (Hematology and Oncology) Lavonna Monarch, MD (Inactive) as Consulting Physician (Dermatology)  DIAGNOSIS:  Encounter Diagnosis  Name Primary?   Malignant neoplasm of lower-outer quadrant of left breast of female, estrogen receptor positive ([place]) Yes    SUMMARY OF ONCOLOGIC HISTORY: Oncology History  Malignant neoplasm of lower-outer quadrant of left breast of female, estrogen receptor positive ([employer])  02/16/2017 Initial Diagnosis   Screening detected left breast mass 8 mm at 4 o'clock position axilla negative biopsy grade 2 invasive ductal carcinoma ER 100%, PR 100%, Ki-67 15%, HER-2 negative ratio 1.58, T1 BN 0 stage I a clinical stage   03/03/2017 Surgery   Left Lumpectomy:0.8 cm IDC grade 1, 0/3 LN neg,  ER 100%, PR 100%, Ki-67 15%, HER-2 negative ratio 1.58 T1bN0 Stage 1A   04/05/2017 - 05/02/2017 Radiation Therapy   Adjuvant radiation therapy    Anti-estrogen oral therapy   Recommended antiestrogen therapy but patient refused     CHIEF COMPLIANT: Surveillance of breast cancer   INTERVAL HISTORY: Nichole Ortega is a 72 y.o. with above-mentioned history of left breast cancer who underwent a lumpectomy, radiation, and refused antiestrogen therapy. She presents to the clinic for a follow-up. She reports that everything has been great.    ALLERGIES:  is allergic to codeine.  MEDICATIONS:  Current Outpatient Medications  Medication Sig Dispense Refill   amLODipine (NORVASC) 10 MG tablet Take 10 mg by mouth daily.     clobetasol ointment (TEMOVATE) 1.24 % Apply 1 application topically 2 (two) times daily. 60 g 2   hydrocortisone 2.5 % ointment Apply topically 2 (two) times daily. 30 g 0   Multiple Vitamins-Minerals  (OCUVITE-LUTEIN PO)      Probiotic CHEW See admin instructions.     Specialty Vitamins Products (COLLAGEN ULTRA) CAPS See admin instructions.     No current facility-administered medications for this visit.    PHYSICAL EXAMINATION: ECOG PERFORMANCE STATUS: 1 - Symptomatic but completely ambulatory  Vitals:   03/22/22 1142  BP: (!) 149/71  Pulse: 99  Resp: 17  Temp: (!) 97.2 F (36.2 C)  SpO2: 100%   Filed Weights   03/22/22 1142  Weight: 151 lb 6.4 oz (68.7 kg)     LABORATORY DATA:  I have reviewed the data as listed    Latest Ref Rng & Units 08/04/2017   11:16 AM 03/01/2017   10:22 AM 02/22/2017   12:02 PM  CMP  Glucose 70 - 140 mg/dL 89  91  137   BUN 7 - 26 mg/dL 13  9  11.1   Creatinine 0.60 - 1.10 mg/dL 0.64  0.50  0.9   Sodium 136 - 145 mmol/L 132  132  134   Potassium 3.5 - 5.1 mmol/L 4.2  4.2  4.1   Chloride 98 - 109 mmol/L 95  95    CO2 22 - 29 mmol/L '28  25  29   '$ Calcium 8.4 - 10.4 mg/dL 9.6  9.8  10.0   Total Protein 6.4 - 8.3 g/dL 7.0   7.5   Total Bilirubin 0.2 - 1.2 mg/dL 0.9   0.96   Alkaline Phos 40 - 150 U/L 56   63   AST 5 - 34 U/L 25   21   ALT 0 - 55 U/L 23  21     Lab Results  Component Value Date   WBC 6.8 03/01/2017   HGB 13.8 03/01/2017   HCT 41.5 03/01/2017   MCV 95.6 03/01/2017   PLT 326 03/01/2017   NEUTROABS 3.8 02/22/2017    ASSESSMENT & PLAN:  Malignant neoplasm of lower-outer quadrant of left breast of female, estrogen receptor positive ([place]) 03/03/18: Left Lumpectomy:0.8 cm IDC grade 1, 0/3 LN neg,  ER 100%, PR 100%, Ki-67 15%, HER-2 negative ratio 1.58 T1bN0 Stage 1A Adjuvant radiation 04/05/2017-05/02/2017   Treatment plan: We recommended letrozole but patient refused to take it.   Surveillance: 1. Breast exam 03/22/22: Benign 2. Mammograms 03/22/2021. at [place].  Breast density category C 3. CT Abd and pelvis: 05/27/2020: Diverticulitis   Patient stays very busy with the boating and recreation as well as nature  [place] activities.    RTC on an as-needed basis.   No orders of the defined types were placed in this encounter.  The patient has a good understanding of the overall plan. she agrees with it. she will call with any problems that may develop before the next visit here. Total time spent: 30 mins including face to face time and time spent for planning, charting and co-ordination of care   [name], MD 03/22/22    I Gardiner Coins am acting as a Education administrator for [employer]  I have reviewed the above documentation for accuracy and completeness, and I agree with the above.

## 2022-03-22 NOTE — Assessment & Plan Note (Addendum)
03/03/18: Left Lumpectomy:0.8 cm IDC grade 1, 0/3 LN neg,  ER 100%, PR 100%, Ki-67 15%, HER-2 negative ratio 1.58 T1bN0 Stage 1A Adjuvant radiation 04/05/2017-05/02/2017   Treatment plan: We recommended letrozole but patient refused to take it.   Surveillance: 1. Breast exam 03/22/22: Benign 2. Mammograms 03/22/2021. at [place].  Breast density category C 3. CT Abd and pelvis: 05/27/2020: Diverticulitis   Patient stays very busy with the boating and recreation as well as nature [place] activities.    RTC on an as-needed basis.

## 2022-03-23 DIAGNOSIS — Z1231 Encounter for screening mammogram for malignant neoplasm of breast: Secondary | ICD-10-CM | POA: Diagnosis not present

## 2022-03-28 ENCOUNTER — Encounter (HOSPITAL_COMMUNITY): Payer: Self-pay

## 2022-10-12 IMAGING — CT CT ABD-PELV W/O CM
2 of 4 series · 16 of 46 positions shown, 18 images · non-contrast
Comparison: None.

CLINICAL DATA: Right-sided abdominal pain since [REDACTED]. History of
oophorectomy and left breast cancer with lumpectomy and radiation
therapy.

EXAM:
CT ABDOMEN AND PELVIS WITHOUT CONTRAST
TECHNIQUE: Multidetector CT imaging of the abdomen and pelvis was performed
following the standard protocol without IV contrast.

[Series 2: routine abdomen pelvis without 5.00 br40 s3 axial · axial · non-contrast · 0.73mm/px · z∈[+1311,+1706]mm · 13 of 87 slices shown, 15 images]
[im 4/87  soft-tissue]
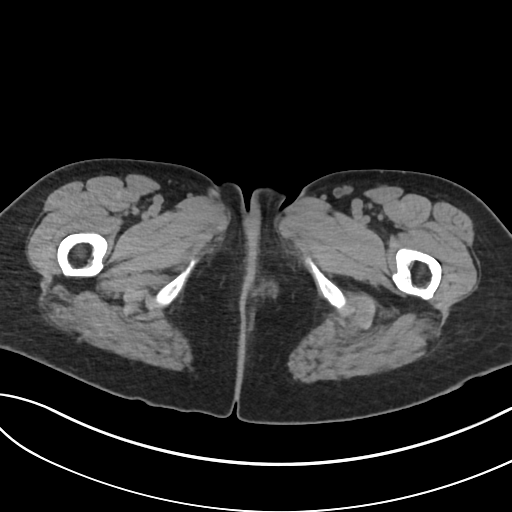
[im 4/87  bone]
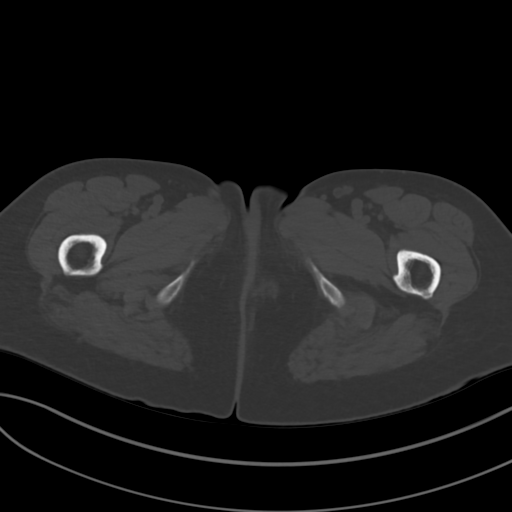
[im 11/87  soft-tissue]
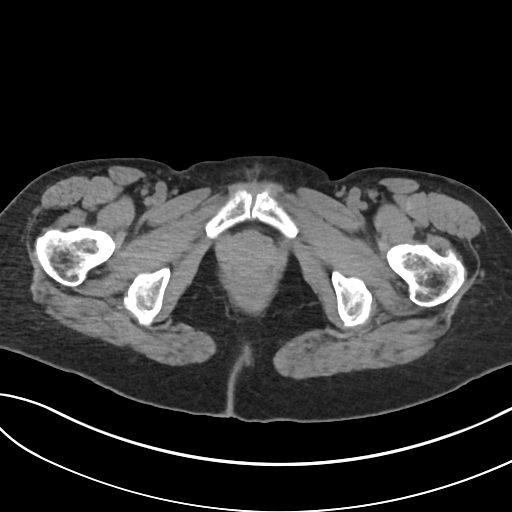
[im 18/87  soft-tissue]
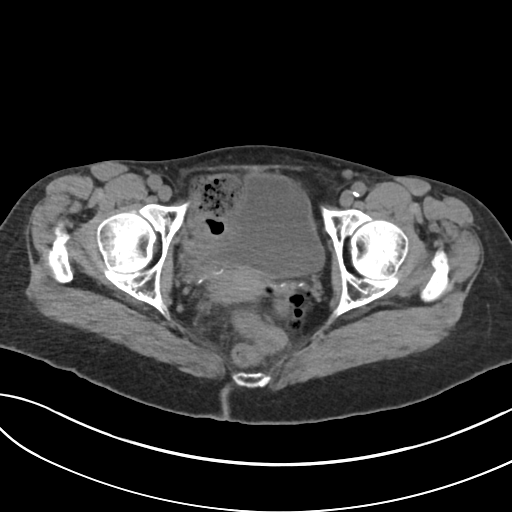
[im 26/87  soft-tissue]
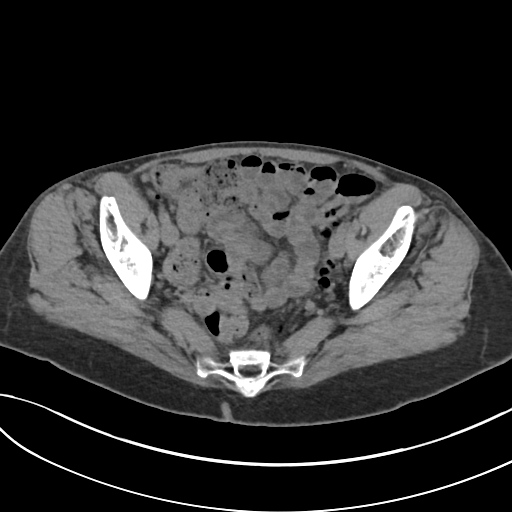
[im 29/87  soft-tissue]
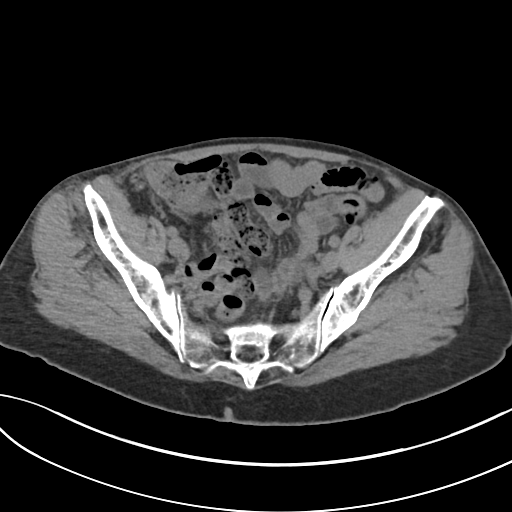
[im 36/87  soft-tissue]
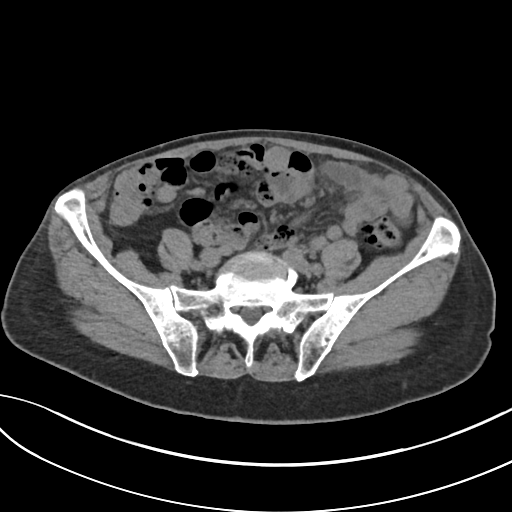
[im 44/87  soft-tissue]
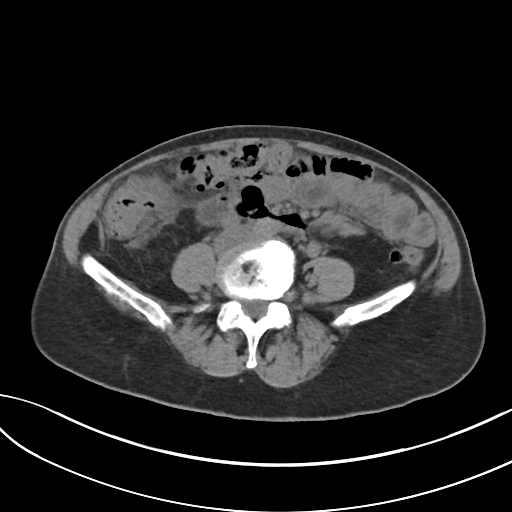
[im 51/87  soft-tissue]
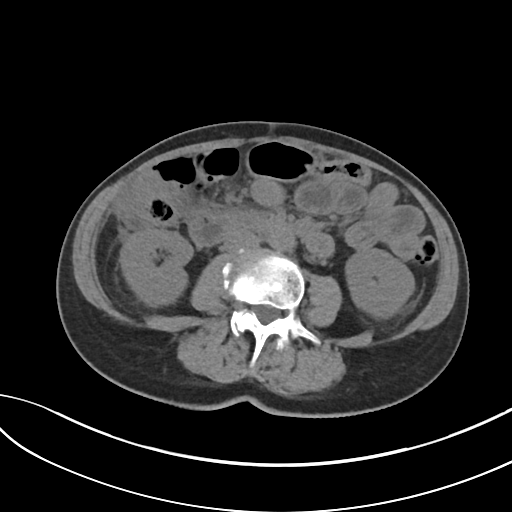
[im 58/87  soft-tissue]
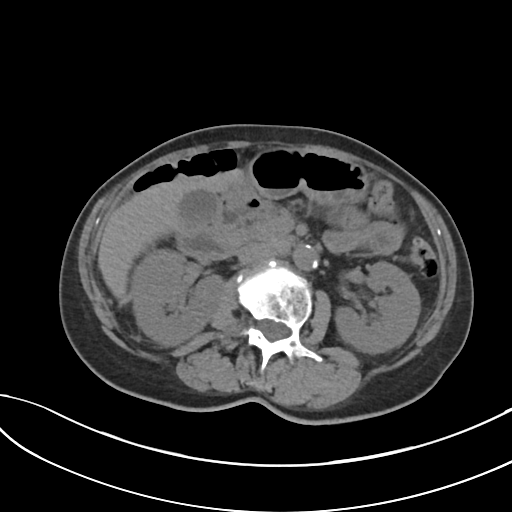
[im 58/87  bone]
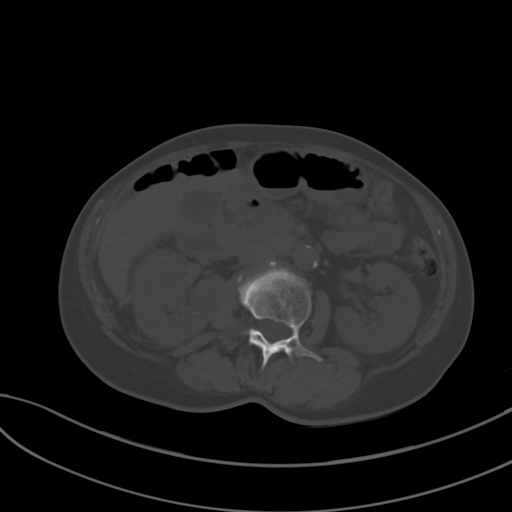
[im 61/87  soft-tissue]
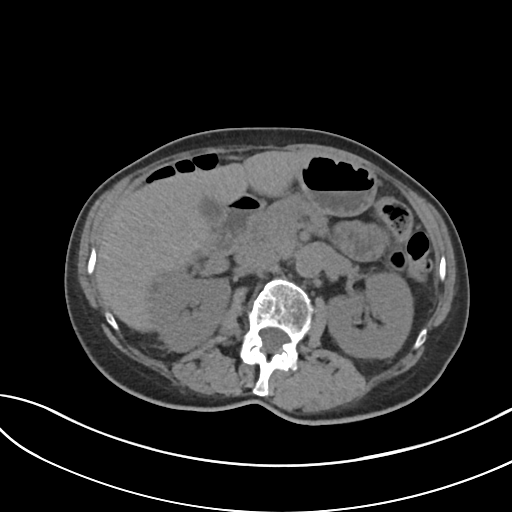
[im 69/87  soft-tissue]
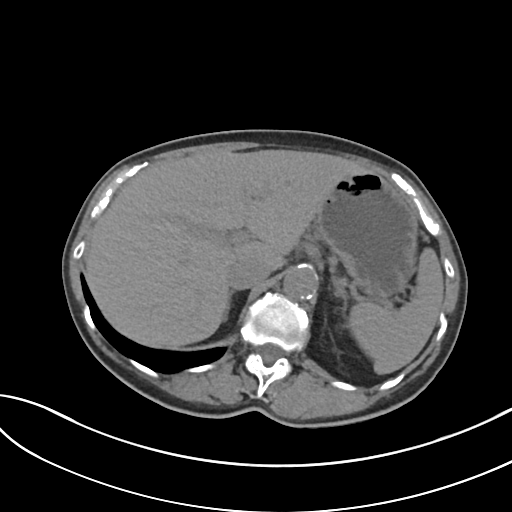
[im 76/87  soft-tissue]
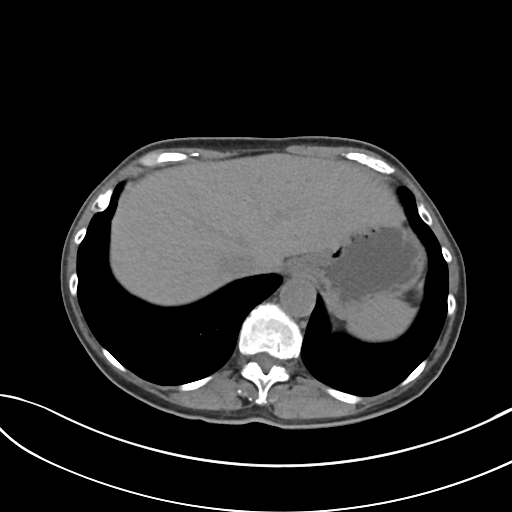
[im 83/87  soft-tissue]
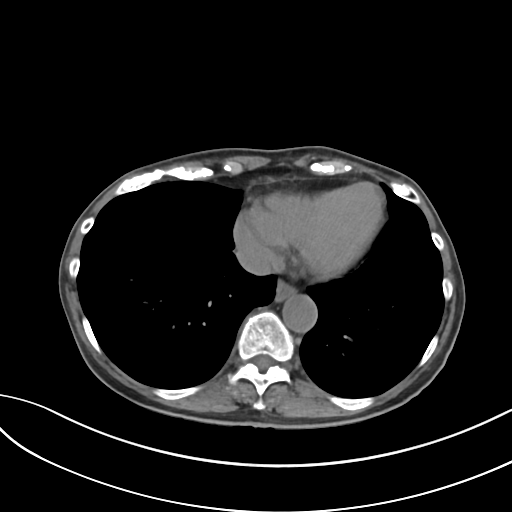

[Series 4: routine abdomen pelvis without 2.00 br40 s3 cor · coronal · non-contrast · 0.73mm/px · 3 of 183 slices shown]
[im 61/183  soft-tissue]
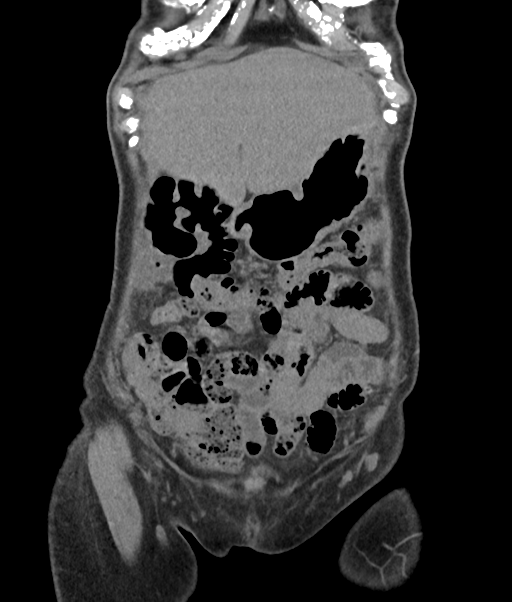
[im 81/183  soft-tissue]
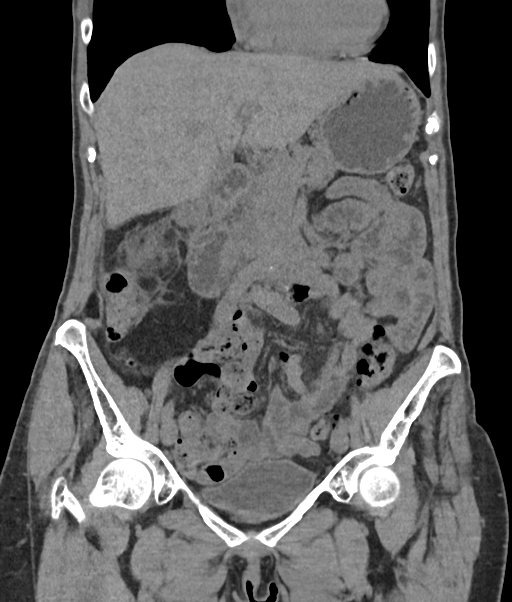
[im 102/183  soft-tissue]
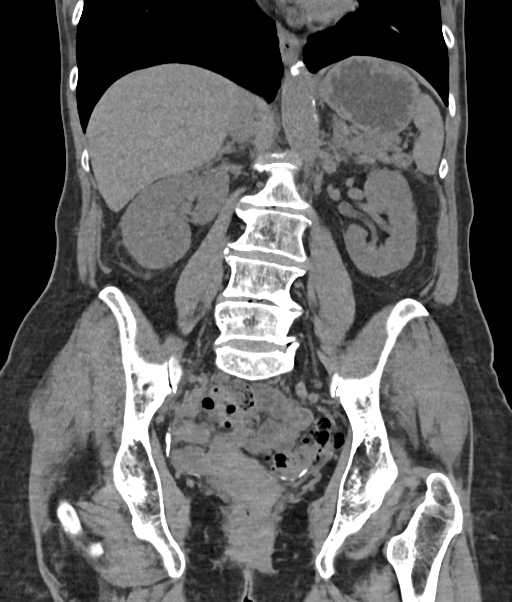

[16 of 46 positions shown; findings below may reference images not displayed]

FINDINGS: Lower chest: Clear lung bases. Normal heart size without pericardial
or pleural effusion.

Hepatobiliary: Normal liver. Normal gallbladder, without biliary
ductal dilatation.

Pancreas: Normal, without mass or ductal dilatation.

Spleen: Normal in size, without focal abnormality.

Adrenals/Urinary Tract: Normal adrenal glands. No renal calculi or
hydronephrosis. No hydroureter or ureteric calculi. No bladder
calculi.

Stomach/Bowel: Normal stomach, without wall thickening.

Extensive colonic diverticulosis. Moderate to marked wall thickening
involves the ascending colon just proximal to the hepatic flexure
including on 40/2. Small focus of gas posterior and medial to the
inflamed colon is likely within a diverticulum on 37/2. No free
perforation or convincing evidence of drainable fluid collection.
Suspicion of mild secondary edema along the right kidney including
on 36/2.

The cecum extends into the upper pelvis. The appendix is retrocecal
including on 62/2. Normal small bowel.

Vascular/Lymphatic: Aortic atherosclerosis. No abdominopelvic
adenopathy.

Reproductive: Normal uterus and adnexa.

Other: No significant free fluid.

Musculoskeletal: Osteopenia. Mild convex left lumbar spine
curvature. Degenerate disc disease primarily at L4-5.
IMPRESSION: 1. Moderate to marked wall thickening involving the ascending colon,
just proximal to the hepatic flexure. Given diverticula in this
area, favored to represent diverticulitis. Colitis or even a colonic
neoplasm could look similar-given focality. Decreased sensitivity
and specificity exam secondary to lack of oral or IV contrast.
2. Suspicion of mild secondary right perirenal edema. Correlate with
urinalysis to exclude pyelonephritis.
3.  Aortic Atherosclerosis ([id number]).

These results will be called to the ordering clinician or
representative by the Radiologist Assistant, and communication
documented in the PACS or [REDACTED].

## 2022-11-01 DIAGNOSIS — Z23 Encounter for immunization: Secondary | ICD-10-CM | POA: Diagnosis not present

## 2022-12-29 DIAGNOSIS — Z23 Encounter for immunization: Secondary | ICD-10-CM | POA: Diagnosis not present

## 2023-01-16 DIAGNOSIS — H353132 Nonexudative age-related macular degeneration, bilateral, intermediate dry stage: Secondary | ICD-10-CM | POA: Diagnosis not present

## 2023-01-16 DIAGNOSIS — Z961 Presence of intraocular lens: Secondary | ICD-10-CM | POA: Diagnosis not present

## 2023-01-16 DIAGNOSIS — H52203 Unspecified astigmatism, bilateral: Secondary | ICD-10-CM | POA: Diagnosis not present

## 2023-01-16 DIAGNOSIS — H524 Presbyopia: Secondary | ICD-10-CM | POA: Diagnosis not present

## 2023-01-19 ENCOUNTER — Other Ambulatory Visit: Payer: Self-pay

## 2023-02-13 DIAGNOSIS — R946 Abnormal results of thyroid function studies: Secondary | ICD-10-CM | POA: Diagnosis not present

## 2023-02-13 DIAGNOSIS — I10 Essential (primary) hypertension: Secondary | ICD-10-CM | POA: Diagnosis not present

## 2023-02-13 DIAGNOSIS — Z13220 Encounter for screening for lipoid disorders: Secondary | ICD-10-CM | POA: Diagnosis not present

## 2023-02-13 DIAGNOSIS — R7309 Other abnormal glucose: Secondary | ICD-10-CM | POA: Diagnosis not present

## 2023-02-13 DIAGNOSIS — Z79899 Other long term (current) drug therapy: Secondary | ICD-10-CM | POA: Diagnosis not present

## 2023-02-13 DIAGNOSIS — E559 Vitamin D deficiency, unspecified: Secondary | ICD-10-CM | POA: Diagnosis not present

## 2023-02-23 DIAGNOSIS — Z79899 Other long term (current) drug therapy: Secondary | ICD-10-CM | POA: Diagnosis not present

## 2023-02-23 DIAGNOSIS — E559 Vitamin D deficiency, unspecified: Secondary | ICD-10-CM | POA: Diagnosis not present

## 2023-02-23 DIAGNOSIS — Z853 Personal history of malignant neoplasm of breast: Secondary | ICD-10-CM | POA: Diagnosis not present

## 2023-02-23 DIAGNOSIS — R946 Abnormal results of thyroid function studies: Secondary | ICD-10-CM | POA: Diagnosis not present

## 2023-02-23 DIAGNOSIS — R7309 Other abnormal glucose: Secondary | ICD-10-CM | POA: Diagnosis not present

## 2023-02-23 DIAGNOSIS — I10 Essential (primary) hypertension: Secondary | ICD-10-CM | POA: Diagnosis not present

## 2023-02-23 DIAGNOSIS — L309 Dermatitis, unspecified: Secondary | ICD-10-CM | POA: Diagnosis not present

## 2023-03-20 DIAGNOSIS — L408 Other psoriasis: Secondary | ICD-10-CM | POA: Diagnosis not present

## 2023-03-20 DIAGNOSIS — L821 Other seborrheic keratosis: Secondary | ICD-10-CM | POA: Diagnosis not present

## 2023-03-20 DIAGNOSIS — D225 Melanocytic nevi of trunk: Secondary | ICD-10-CM | POA: Diagnosis not present

## 2023-03-29 DIAGNOSIS — Z1231 Encounter for screening mammogram for malignant neoplasm of breast: Secondary | ICD-10-CM | POA: Diagnosis not present

## 2023-04-03 ENCOUNTER — Encounter: Payer: Self-pay | Admitting: Hematology and Oncology

## 2023-04-06 DIAGNOSIS — R7309 Other abnormal glucose: Secondary | ICD-10-CM | POA: Diagnosis not present

## 2023-04-06 DIAGNOSIS — Z79899 Other long term (current) drug therapy: Secondary | ICD-10-CM | POA: Diagnosis not present

## 2023-04-06 DIAGNOSIS — I10 Essential (primary) hypertension: Secondary | ICD-10-CM | POA: Diagnosis not present

## 2023-04-06 DIAGNOSIS — E559 Vitamin D deficiency, unspecified: Secondary | ICD-10-CM | POA: Diagnosis not present

## 2023-04-06 DIAGNOSIS — Z13220 Encounter for screening for lipoid disorders: Secondary | ICD-10-CM | POA: Diagnosis not present

## 2023-04-06 DIAGNOSIS — R946 Abnormal results of thyroid function studies: Secondary | ICD-10-CM | POA: Diagnosis not present

## 2023-05-06 ENCOUNTER — Emergency Department (HOSPITAL_BASED_OUTPATIENT_CLINIC_OR_DEPARTMENT_OTHER)
Admission: EM | Admit: 2023-05-06 | Discharge: 2023-05-06 | Disposition: A | Discharge status: Home / Self Care | Attending: Emergency Medicine | Admitting: Emergency Medicine

## 2023-05-06 ENCOUNTER — Other Ambulatory Visit: Payer: Self-pay

## 2023-05-06 DIAGNOSIS — H4312 Vitreous hemorrhage, left eye: Secondary | ICD-10-CM | POA: Insufficient documentation

## 2023-05-06 DIAGNOSIS — H538 Other visual disturbances: Secondary | ICD-10-CM | POA: Diagnosis present

## 2023-05-06 MED ORDER — FLUORESCEIN SODIUM 1 MG OP STRP
1.0000 | ORAL_STRIP | Freq: Once | OPHTHALMIC | Status: AC
  Administered 2023-05-06: 1 via OPHTHALMIC
  Filled 2023-05-06: qty 1

## 2023-05-06 MED ORDER — TETRACAINE HCL 0.5 % OP SOLN
2.0000 [drp] | Freq: Once | OPHTHALMIC | Status: AC
  Administered 2023-05-06: 2 [drp] via OPHTHALMIC
  Filled 2023-05-06: qty 4

## 2023-05-06 NOTE — ED Provider Notes (Signed)
 [place] EMERGENCY DEPARTMENT AT [facility] Provider Note   CSN: [id number] Arrival date & time: 05/06/23  1705     History {Add pertinent medical, surgical, social history, OB history to HPI:1} No chief complaint on file.   Nichole Ortega is a 73 y.o. female.  73 year old female with a history of bilateral cataracts status post lens replacement who presents emergency department painless vision loss of her left eye.  Patient reports that at 2 PM she started experiencing floaters and vision changes that look like she was staring through an oil slick.  Also had some red in her vision that she thought was blood.  Temporarily improved and then got worse and now can only see light out of her left eye.  Says it is totally painless.       Home Medications Prior to Admission medications   Medication Sig Start Date End Date Taking? Authorizing Provider  amLODipine (NORVASC) 10 MG tablet Take 10 mg by mouth daily.    [provider]  clobetasol ointment (TEMOVATE) 0.05 % Apply 1 application topically 2 (two) times daily. 08/27/19   Janalyn Harder, MD  hydrocortisone 2.5 % ointment Apply topically 2 (two) times daily. 06/29/21   Janalyn Harder, MD  Multiple Vitamins-Minerals (OCUVITE-LUTEIN PO)     [provider]  Probiotic CHEW See admin instructions.    [provider]  Specialty Vitamins Products (COLLAGEN ULTRA) CAPS See admin instructions.    [provider]      Allergies    Codeine    Review of Systems   Review of Systems  Physical Exam Updated Vital Signs BP (!) 156/72   Pulse 79   Temp 98 F (36.7 C) (Oral)   Resp 16   Wt 68 kg   SpO2 99%   BMI 23.49 kg/m  Physical Exam Vitals and nursing note reviewed.  Constitutional:      General: She is not in acute distress.    Appearance: She is well-developed.  HENT:     Head: Normocephalic and atraumatic.     Right Ear: External ear normal.     Left Ear: External ear normal.      Nose: Nose normal.  Eyes:     Extraocular Movements: Extraocular movements intact.     Conjunctiva/sclera: Conjunctivae normal.     Pupils: Pupils are equal, round, and reactive to light.     Comments: Pupils 3 mm bilaterally.  Able to see motion out of the left eye and light.  Unable to count fingers.  Vision 20/20 out of right eye with glasses in place.  Musculoskeletal:     Cervical back: Normal range of motion and neck supple.     Right lower leg: No edema.     Left lower leg: No edema.  Skin:    General: Skin is warm and dry.  Neurological:     Mental Status: She is alert and oriented to person, place, and time. Mental status is at baseline.  Psychiatric:        Mood and Affect: Mood normal.     ED Results / Procedures / Treatments   Labs (all labs ordered are listed, but only abnormal results are displayed) Labs Reviewed - No data to display  EKG None  Radiology No results found.  Procedures Procedures  {Document cardiac monitor, telemetry assessment procedure when appropriate:1} EMERGENCY DEPARTMENT [place] OCULAR EXAM "Study: Limited Ultrasound of Orbit "  INDICATIONS: Vision loss and Floaters/Flashes Linear probe utilized to obtain images  in both long and short axis of the orbit having the patient look left and right if possible.  PERFORMED BY: Myself IMAGES ARCHIVED?: Yes LIMITATIONS: none VIEWS USED: Left orbit INTERPRETATION: No retinal detachment, Lens in proper position, debris in vitreous chamber    Medications Ordered in ED Medications  tetracaine (PONTOCAINE) 0.5 % ophthalmic solution 2 drop (has no administration in time range)  fluorescein ophthalmic strip 1 strip (has no administration in time range)    ED Course/ Medical Decision Making/ A&P Clinical Course as of 05/06/23 1846  Sat May 06, 2023  1845 Dr Zenaida Niece from [facility]htho to see the patient.  [RP]    Clinical Course User Index [RP] Rondel Baton, MD   {   Click here for ABCD2, HEART  and other calculatorsREFRESH Note before signing :1}                              Medical Decision Making Risk Prescription drug management.   ***  {Document critical care time when appropriate:1} {Document review of labs and clinical decision tools ie heart score, Chads2Vasc2 etc:1}  {Document your independent review of radiology images, and any outside records:1} {Document your discussion with family members, caretakers, and with consultants:1} {Document social determinants of health affecting pt's care:1} {Document your decision making why or why not admission, treatments were needed:1} Final Clinical Impression(s) / ED Diagnoses Final diagnoses:  None    Rx / DC Orders ED Discharge Orders     None

## 2023-05-06 NOTE — Consult Note (Signed)
 Ophthalmology Consult Note  Subjective:  Patient is a 73 year old female who had painless loss of vision in the left eye at 2pm today. States it started off as floaters and then gradual loss of vision. Had cataract surgery 2 summers ago, was not a high myope prior to cataract surgery. Mother had a retinal detachment. Denies any recent trauma  Objective: Vital signs in last 24 hours: Temp:  [98 F (36.7 C)] 98 F (36.7 C) (03/01 1718) Pulse Rate:  [79-91] 91 (03/01 1900) Resp:  [16] 16 (03/01 1900) BP: (115-156)/(72-89) 115/89 (03/01 1900) SpO2:  [99 %] 99 % (03/01 1900) Weight:  [68 kg] 68 kg (03/01 1718) Weight change:     Intake/Output from previous day: No intake/output data recorded. Intake/Output this shift: No intake/output data recorded.  Base Eye Exam  Visual Acuity (ETDRS)   Right Left  Near cc 20/40 HM  Dist ph [place]     Tonometry (Tonopen)   Right Left  Pressure 13 12   Pupils   APD  Right None  Left None   Visual Fields   Right Left   Full Unable due to decreased vision   Extraocular Movement   Right Left   Full Full   Neuro/Psych  Oriented x3: Yes  Mood/Affect: Normal         Slit Lamp and Fundus Exam   External Exam   Right Left  External Normal Normal   Slit Lamp Exam   Right Left  Lids/Lashes Normal Normal  Conjunctiva/Sclera White and quiet White and quiet  Cornea Clear Clear  Anterior Chamber Deep and quiet Deep and quiet  Iris Grossly normal Grossly normal  Lens PCIOL PCIOL       Fundus Exam   Right Clear  Posterior Vitreous  Vitreous hemorrhage  Disc    C/D Ratio    Macula    Vessels    Periphery       No results for input(s): "WBC", "HGB", "HCT", "NA", "K", "CL", "CO2", "BUN", "CREATININE", "GLU" in the last 72 hours.  Invalid input(s): "PLATELETS"  Studies/Results: No results found.  Medications: I have reviewed the patient's current medications.  Assessment/Plan:  Vitreous hemorrhage, Left  eye -Poor view due to hemorrhage. No retinal detachment on B-scan. Patient denies being a diabetic, not likely PDR. She reports she was lifting something heavy when it occurred, possibly valsalva related. Concern for retinal tear causing VH. Will follow her closely for resolution. Patient instructed to return to our office tomorrow at [facility] for repeat dilated exam and B-scan. Instructed to sleep with the head of the bed elevated, no heavy lifting, avoid NSAIDs and ASA.  Follow up with Dr. Sharyn Dross at [facility] Address: [address] [place], [place] [address] Phone: [phone]. Fax: [phone]    LOS: 0 days   Emyah Roznowski T Kenae Lindquist 05/06/2023

## 2023-05-06 NOTE — ED Triage Notes (Signed)
 Sudden onset of blurry vision (seeing swimmers) in her left eye about 2 pm today. No other neuro deficits.

## 2023-05-06 NOTE — ED Triage Notes (Signed)
 Pa in triage to assess. She now states started 3pm

## 2023-05-06 NOTE — ED Triage Notes (Signed)
 Have paged MD paterson

## 2023-05-06 NOTE — Discharge Instructions (Addendum)
 You were seen for your vision loss in the emergency department.   At home, please sleep with your head elevated tonight.  Avoid heavy lifting, ibuprofen, NSAIDs, and aspirin.   Check your MyChart online for the results of any tests that had not resulted by the time you left the emergency department.   Follow-up with ophthalmology tomorrow at 9 AM.  Return immediately to the emergency department if you experience any of the following: Severe pain, or any other concerning symptoms.    Thank you for visiting our Emergency Department. It was a pleasure taking care of you today.

## 2023-05-06 NOTE — ED Notes (Signed)
Visual acuity completed by provider.

## 2023-05-07 DIAGNOSIS — H4312 Vitreous hemorrhage, left eye: Secondary | ICD-10-CM | POA: Diagnosis not present

## 2023-05-08 DIAGNOSIS — H4312 Vitreous hemorrhage, left eye: Secondary | ICD-10-CM | POA: Diagnosis not present

## 2023-05-09 DIAGNOSIS — H4312 Vitreous hemorrhage, left eye: Secondary | ICD-10-CM | POA: Diagnosis not present

## 2023-05-09 DIAGNOSIS — H35361 Drusen (degenerative) of macula, right eye: Secondary | ICD-10-CM | POA: Diagnosis not present

## 2023-05-09 DIAGNOSIS — H33312 Horseshoe tear of retina without detachment, left eye: Secondary | ICD-10-CM | POA: Diagnosis not present

## 2023-05-09 DIAGNOSIS — H35042 Retinal micro-aneurysms, unspecified, left eye: Secondary | ICD-10-CM | POA: Diagnosis not present

## 2023-05-09 DIAGNOSIS — H43811 Vitreous degeneration, right eye: Secondary | ICD-10-CM | POA: Diagnosis not present

## 2023-05-17 DIAGNOSIS — H35042 Retinal micro-aneurysms, unspecified, left eye: Secondary | ICD-10-CM | POA: Diagnosis not present

## 2023-05-17 DIAGNOSIS — H4312 Vitreous hemorrhage, left eye: Secondary | ICD-10-CM | POA: Diagnosis not present

## 2023-05-17 DIAGNOSIS — Z9889 Other specified postprocedural states: Secondary | ICD-10-CM | POA: Diagnosis not present

## 2023-06-08 DIAGNOSIS — Z9889 Other specified postprocedural states: Secondary | ICD-10-CM | POA: Diagnosis not present

## 2023-06-08 DIAGNOSIS — H4312 Vitreous hemorrhage, left eye: Secondary | ICD-10-CM | POA: Diagnosis not present

## 2023-06-08 DIAGNOSIS — H35042 Retinal micro-aneurysms, unspecified, left eye: Secondary | ICD-10-CM | POA: Diagnosis not present

## 2023-07-13 DIAGNOSIS — L308 Other specified dermatitis: Secondary | ICD-10-CM | POA: Diagnosis not present

## 2023-07-13 DIAGNOSIS — L239 Allergic contact dermatitis, unspecified cause: Secondary | ICD-10-CM | POA: Diagnosis not present

## 2023-07-24 DIAGNOSIS — H00019 Hordeolum externum unspecified eye, unspecified eyelid: Secondary | ICD-10-CM | POA: Diagnosis not present

## 2023-07-24 DIAGNOSIS — R220 Localized swelling, mass and lump, head: Secondary | ICD-10-CM | POA: Diagnosis not present

## 2023-07-24 DIAGNOSIS — L299 Pruritus, unspecified: Secondary | ICD-10-CM | POA: Diagnosis not present

## 2023-09-14 DIAGNOSIS — H43821 Vitreomacular adhesion, right eye: Secondary | ICD-10-CM | POA: Diagnosis not present

## 2023-09-14 DIAGNOSIS — H35361 Drusen (degenerative) of macula, right eye: Secondary | ICD-10-CM | POA: Diagnosis not present

## 2023-09-14 DIAGNOSIS — H35042 Retinal micro-aneurysms, unspecified, left eye: Secondary | ICD-10-CM | POA: Diagnosis not present

## 2023-09-15 DIAGNOSIS — R21 Rash and other nonspecific skin eruption: Secondary | ICD-10-CM | POA: Diagnosis not present

## 2023-09-15 DIAGNOSIS — J3081 Allergic rhinitis due to animal (cat) (dog) hair and dander: Secondary | ICD-10-CM | POA: Diagnosis not present

## 2023-09-15 DIAGNOSIS — L299 Pruritus, unspecified: Secondary | ICD-10-CM | POA: Diagnosis not present

## 2023-09-15 DIAGNOSIS — R079 Chest pain, unspecified: Secondary | ICD-10-CM | POA: Diagnosis not present

## 2023-09-15 DIAGNOSIS — T783XXD Angioneurotic edema, subsequent encounter: Secondary | ICD-10-CM | POA: Diagnosis not present

## 2023-11-15 DIAGNOSIS — Z23 Encounter for immunization: Secondary | ICD-10-CM | POA: Diagnosis not present

## 2023-11-23 DIAGNOSIS — Z23 Encounter for immunization: Secondary | ICD-10-CM | POA: Diagnosis not present

## 2024-01-18 DIAGNOSIS — H43813 Vitreous degeneration, bilateral: Secondary | ICD-10-CM | POA: Diagnosis not present

## 2024-01-18 DIAGNOSIS — H524 Presbyopia: Secondary | ICD-10-CM | POA: Diagnosis not present

## 2024-01-18 DIAGNOSIS — H52203 Unspecified astigmatism, bilateral: Secondary | ICD-10-CM | POA: Diagnosis not present

## 2024-01-18 DIAGNOSIS — H31002 Unspecified chorioretinal scars, left eye: Secondary | ICD-10-CM | POA: Diagnosis not present

## 2024-01-18 DIAGNOSIS — Z961 Presence of intraocular lens: Secondary | ICD-10-CM | POA: Diagnosis not present

## 2024-01-18 DIAGNOSIS — H353132 Nonexudative age-related macular degeneration, bilateral, intermediate dry stage: Secondary | ICD-10-CM | POA: Diagnosis not present

## 2024-02-19 DIAGNOSIS — Z79899 Other long term (current) drug therapy: Secondary | ICD-10-CM | POA: Diagnosis not present

## 2024-02-19 DIAGNOSIS — R946 Abnormal results of thyroid function studies: Secondary | ICD-10-CM | POA: Diagnosis not present

## 2024-02-19 DIAGNOSIS — E559 Vitamin D deficiency, unspecified: Secondary | ICD-10-CM | POA: Diagnosis not present

## 2024-02-19 DIAGNOSIS — R7309 Other abnormal glucose: Secondary | ICD-10-CM | POA: Diagnosis not present

## 2024-02-19 DIAGNOSIS — Z13220 Encounter for screening for lipoid disorders: Secondary | ICD-10-CM | POA: Diagnosis not present

## 2024-02-19 DIAGNOSIS — I10 Essential (primary) hypertension: Secondary | ICD-10-CM | POA: Diagnosis not present
